# Patient Record
Sex: Female | Born: 1985 | Race: Black or African American | Hispanic: No | Marital: Single | Smoking: Never smoker
Health system: Southern US, Community
[De-identification: ages and names within clinical notes are randomized; demographics above are authoritative.]

## PROBLEM LIST (undated history)

## (undated) ENCOUNTER — Inpatient Hospital Stay: Payer: Self-pay

## (undated) DIAGNOSIS — Z8742 Personal history of other diseases of the female genital tract: Secondary | ICD-10-CM

## (undated) DIAGNOSIS — K219 Gastro-esophageal reflux disease without esophagitis: Secondary | ICD-10-CM

## (undated) HISTORY — PX: CHOLECYSTECTOMY: SHX55

## (undated) HISTORY — DX: Personal history of other diseases of the female genital tract: Z87.42

## (undated) HISTORY — DX: Gastro-esophageal reflux disease without esophagitis: K21.9

## (undated) HISTORY — PX: COLPOSCOPY: SHX161

---

## 2006-08-28 ENCOUNTER — Emergency Department: Payer: Self-pay | Admitting: Emergency Medicine

## 2006-10-05 ENCOUNTER — Emergency Department: Payer: Self-pay

## 2006-12-07 ENCOUNTER — Observation Stay: Payer: Self-pay

## 2007-01-14 ENCOUNTER — Observation Stay: Payer: Self-pay | Admitting: Unknown Physician Specialty

## 2007-03-11 ENCOUNTER — Observation Stay: Payer: Self-pay | Admitting: Obstetrics and Gynecology

## 2007-03-14 ENCOUNTER — Observation Stay: Payer: Self-pay

## 2007-03-26 ENCOUNTER — Observation Stay: Payer: Self-pay | Admitting: Obstetrics & Gynecology

## 2007-03-28 ENCOUNTER — Observation Stay: Payer: Self-pay

## 2007-04-19 ENCOUNTER — Inpatient Hospital Stay: Payer: Self-pay | Admitting: Obstetrics & Gynecology

## 2007-04-23 ENCOUNTER — Emergency Department: Payer: Self-pay | Admitting: Emergency Medicine

## 2010-04-10 ENCOUNTER — Emergency Department: Payer: Self-pay | Admitting: Emergency Medicine

## 2012-08-01 ENCOUNTER — Emergency Department: Payer: Self-pay | Admitting: Emergency Medicine

## 2012-08-01 LAB — CBC
HCT: 36 % (ref 35.0–47.0)
HGB: 12.3 g/dL (ref 12.0–16.0)
MCH: 31.6 pg (ref 26.0–34.0)
MCHC: 34.3 g/dL (ref 32.0–36.0)
MCV: 92 fL (ref 80–100)

## 2012-08-01 LAB — URINALYSIS, COMPLETE
Glucose,UR: NEGATIVE mg/dL (ref 0–75)
Nitrite: NEGATIVE
RBC,UR: 3 /HPF (ref 0–5)
Specific Gravity: 1.026 (ref 1.003–1.030)
Squamous Epithelial: 38

## 2012-08-01 LAB — COMPREHENSIVE METABOLIC PANEL
Albumin: 3.9 g/dL (ref 3.4–5.0)
Alkaline Phosphatase: 90 U/L (ref 50–136)
Anion Gap: 6 — ABNORMAL LOW (ref 7–16)
BUN: 8 mg/dL (ref 7–18)
Calcium, Total: 9 mg/dL (ref 8.5–10.1)
Glucose: 90 mg/dL (ref 65–99)
SGOT(AST): 27 U/L (ref 15–37)
SGPT (ALT): 23 U/L (ref 12–78)
Total Protein: 7.5 g/dL (ref 6.4–8.2)

## 2012-08-01 LAB — TROPONIN I: Troponin-I: <0.02 ng/mL

## 2012-11-30 ENCOUNTER — Encounter (HOSPITAL_COMMUNITY): Payer: Self-pay | Admitting: Emergency Medicine

## 2012-11-30 ENCOUNTER — Emergency Department (HOSPITAL_COMMUNITY)
Admission: EM | Admit: 2012-11-30 | Discharge: 2012-11-30 | Disposition: A | Discharge status: Home / Self Care | Payer: Self-pay | Attending: Emergency Medicine | Admitting: Emergency Medicine

## 2012-11-30 ENCOUNTER — Emergency Department (HOSPITAL_COMMUNITY): Payer: Self-pay

## 2012-11-30 DIAGNOSIS — Z3202 Encounter for pregnancy test, result negative: Secondary | ICD-10-CM | POA: Insufficient documentation

## 2012-11-30 DIAGNOSIS — R51 Headache: Secondary | ICD-10-CM | POA: Insufficient documentation

## 2012-11-30 DIAGNOSIS — R0602 Shortness of breath: Secondary | ICD-10-CM | POA: Insufficient documentation

## 2012-11-30 DIAGNOSIS — R059 Cough, unspecified: Secondary | ICD-10-CM | POA: Insufficient documentation

## 2012-11-30 DIAGNOSIS — K219 Gastro-esophageal reflux disease without esophagitis: Secondary | ICD-10-CM | POA: Insufficient documentation

## 2012-11-30 DIAGNOSIS — R0982 Postnasal drip: Secondary | ICD-10-CM | POA: Insufficient documentation

## 2012-11-30 DIAGNOSIS — J069 Acute upper respiratory infection, unspecified: Secondary | ICD-10-CM | POA: Insufficient documentation

## 2012-11-30 DIAGNOSIS — J3489 Other specified disorders of nose and nasal sinuses: Secondary | ICD-10-CM | POA: Insufficient documentation

## 2012-11-30 DIAGNOSIS — R05 Cough: Secondary | ICD-10-CM | POA: Insufficient documentation

## 2012-11-30 MED ORDER — BENZONATATE 100 MG PO CAPS
200.0000 mg | ORAL_CAPSULE | Freq: Once | ORAL | Status: AC
  Administered 2012-11-30: 200 mg via ORAL
  Filled 2012-11-30: qty 2

## 2012-11-30 MED ORDER — BENZONATATE 200 MG PO CAPS: 200.0000 mg | ORAL_CAPSULE | Freq: Three times a day (TID) | ORAL | Status: DC | PRN

## 2012-11-30 NOTE — ED Notes (Signed)
Patient c/o nadsal congestion with headache and sore throat. Patient reports occasional productive cough-thick yellow sputum.

## 2012-11-30 NOTE — ED Provider Notes (Signed)
History     CSN: [id number]  Arrival date & time 11/30/12  1020   First MD Initiated Contact with Patient 11/30/12 1025      Chief Complaint  Patient presents with  . Sore Throat  . Nasal Congestion  . Headache    (Consider location/radiation/quality/duration/timing/severity/associated sxs/prior treatment) HPI Comments: Natasha Myers presents with a one-day history of nasal congestion along with postnasal drip, generalized headache, sore throat and cough which has been productive of yellow sputum.  She does report shortness of breath but denies chest pain, fevers or chill and  Wheezing.  She has taken no medications prior to arrival.  She has no significant past medical history, but does use Nexium for chronic acid reflux disease.  Note, her mother is here also to be seen for similar symptoms.  The history is provided by the patient.    History reviewed. No pertinent past medical history.  Past Surgical History  Procedure Date  . Cesarean section     Family History  Problem Relation Age of Onset  . Diabetes Other     History  Substance Use Topics  . Smoking status: Never Smoker   . Smokeless tobacco: Never Used  . Alcohol Use: No    OB History    Grav Para Term Preterm Abortions TAB SAB Ect Mult Living   1 1 1       1       Review of Systems  Constitutional: Negative for fever and chills.  HENT: Positive for congestion, sore throat and rhinorrhea. Negative for trouble swallowing, neck pain, voice change and sinus pressure.   Eyes: Negative.   Respiratory: Positive for cough and shortness of breath. Negative for chest tightness.   Cardiovascular: Negative for chest pain.  Gastrointestinal: Negative for nausea and abdominal pain.  Genitourinary: Negative.   Musculoskeletal: Negative for joint swelling and arthralgias.  Skin: Negative.  Negative for rash and wound.  Neurological: Negative for dizziness, weakness, light-headedness, numbness and headaches.   Hematological: Negative.   Psychiatric/Behavioral: Negative.     Allergies  Review of patient's allergies indicates no known allergies.  Home Medications   Current Outpatient Rx  Name  Route  Sig  Dispense  Refill  . ESOMEPRAZOLE MAGNESIUM 20 MG PO CPDR   Oral   Take 20 mg by mouth 2 (two) times daily.         Marland Kitchen BENZONATATE 200 MG PO CAPS   Oral   Take 1 capsule (200 mg total) by mouth 3 (three) times daily as needed for cough.   20 capsule   0     LMP 11/30/2012  Physical Exam  Constitutional: She is oriented to person, place, and time. She appears well-developed and well-nourished.  HENT:  Head: Normocephalic and atraumatic.  Right Ear: Tympanic membrane, external ear and ear canal normal.  Left Ear: Tympanic membrane, external ear and ear canal normal.  Nose: Mucosal edema and rhinorrhea present. Right sinus exhibits no maxillary sinus tenderness and no frontal sinus tenderness. Left sinus exhibits no maxillary sinus tenderness and no frontal sinus tenderness.  Mouth/Throat: Uvula is midline, oropharynx is clear and moist and mucous membranes are normal. No oropharyngeal exudate, posterior oropharyngeal edema, posterior oropharyngeal erythema or tonsillar abscesses.  Eyes: Conjunctivae normal are normal.  Cardiovascular: Normal rate, regular rhythm and normal heart sounds.   Pulmonary/Chest: Effort normal. No respiratory distress. She has no wheezes. She has rhonchi. She has no rales.       Sparse rhonchi right  upper lung field which clears with cough.  Abdominal: Soft. There is no tenderness.  Musculoskeletal: Normal range of motion.  Neurological: She is alert and oriented to person, place, and time.  Skin: Skin is warm and dry. No rash noted.  Psychiatric: She has a normal mood and affect.    ED Course  Procedures (including critical care time)  Labs Reviewed - No data to display Dg Chest 2 View  11/30/2012  *RADIOLOGY REPORT*  Clinical Data: Cough and  congestion  CHEST - 2 VIEW  Comparison: None.  Findings: Cardiomediastinal silhouette is within normal limits. The lungs are clear. No pleural effusion.  No pneumothorax.  No acute osseous abnormality.  IMPRESSION: Normal chest.   Original Report Authenticated By: Christiana Pellant, M.D.      1. URI, acute       MDM  X-rays reviewed prior to discharge home.  She was given a dose of Tessalon while here which did help her coughing and she was prescribed additional Tessalon pearls for home use.  Encouraged rest, increase fluids.  When necessary followup.  Reasons to return include increased shortness of breath, weakness, fever.        Burgess Amor, [place] 11/30/12 [phone]

## 2012-12-01 LAB — POCT PREGNANCY, URINE: Preg Test, Ur: NEGATIVE

## 2012-12-01 NOTE — ED Provider Notes (Signed)
Medical screening examination/treatment/procedure(s) were performed by non-physician practitioner and as supervising physician I was immediately available for consultation/collaboration.  Shelda Jakes, MD 12/01/12 [phone]

## 2013-01-10 ENCOUNTER — Ambulatory Visit: Payer: Self-pay | Admitting: Family Medicine

## 2013-02-16 ENCOUNTER — Emergency Department: Payer: Self-pay | Admitting: Internal Medicine

## 2013-02-16 LAB — COMPREHENSIVE METABOLIC PANEL
Albumin: 3.3 g/dL — ABNORMAL LOW (ref 3.4–5.0)
Chloride: 106 mmol/L (ref 98–107)
Creatinine: 0.65 mg/dL (ref 0.60–1.30)
EGFR (Non-African Amer.): >60
Osmolality: 275 (ref 275–301)
Potassium: 3.3 mmol/L — ABNORMAL LOW (ref 3.5–5.1)
SGOT(AST): 38 U/L — ABNORMAL HIGH (ref 15–37)
SGPT (ALT): 49 U/L (ref 12–78)
Sodium: 139 mmol/L (ref 136–145)

## 2013-02-16 LAB — CBC
MCH: 31 pg (ref 26.0–34.0)
MCV: 94 fL (ref 80–100)
Platelet: 377 10*3/uL (ref 150–440)
RBC: 3.55 10*6/uL — ABNORMAL LOW (ref 3.80–5.20)
RDW: 12.6 % (ref 11.5–14.5)

## 2013-02-16 LAB — URINALYSIS, COMPLETE
Leukocyte Esterase: NEGATIVE
Ph: 6 (ref 4.5–8.0)
Protein: NEGATIVE
RBC,UR: 2 /HPF (ref 0–5)
WBC UR: 2 /HPF (ref 0–5)

## 2013-02-16 LAB — LIPASE, BLOOD: Lipase: 198 U/L (ref 73–393)

## 2013-07-15 IMAGING — US ABDOMEN ULTRASOUND
1 series · 14 of 25 positions shown · non-contrast
Comparison: none

REASON FOR EXAM: Abd Pain Upper
COMMENTS:

[Series 1: abdomen ultrasound · 0.25mm/px · 14 of 81 slices shown]
[im 1/81]
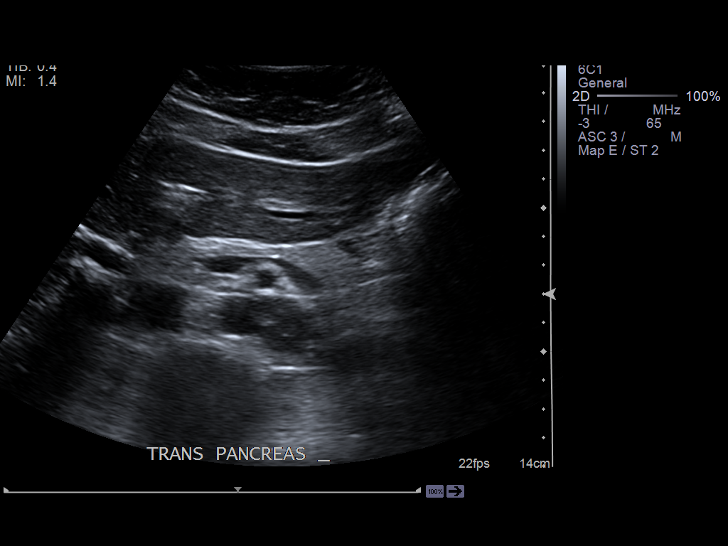
[im 7/81]
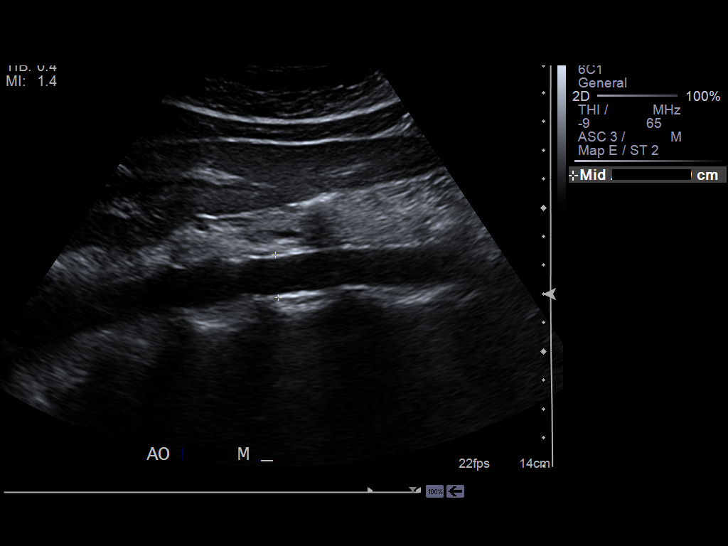
[im 14/81]
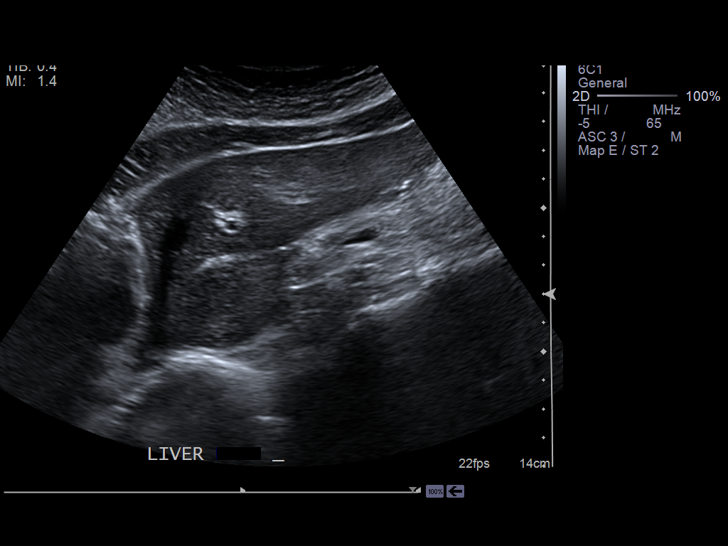
[im 21/81]
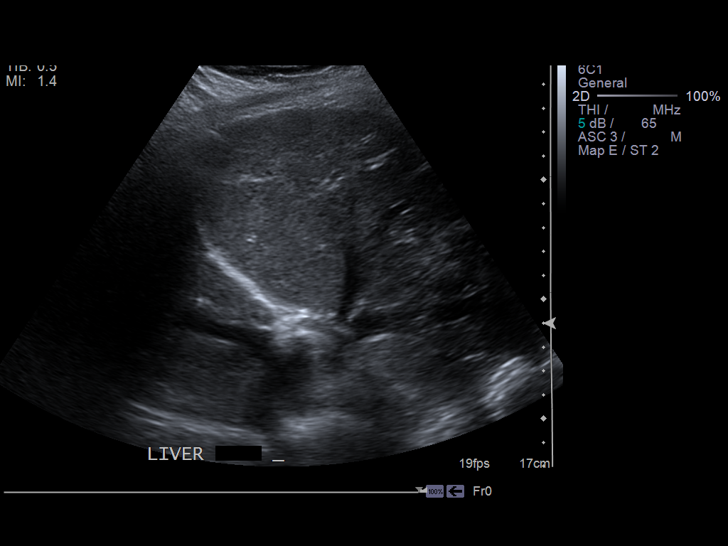
[im 27/81]
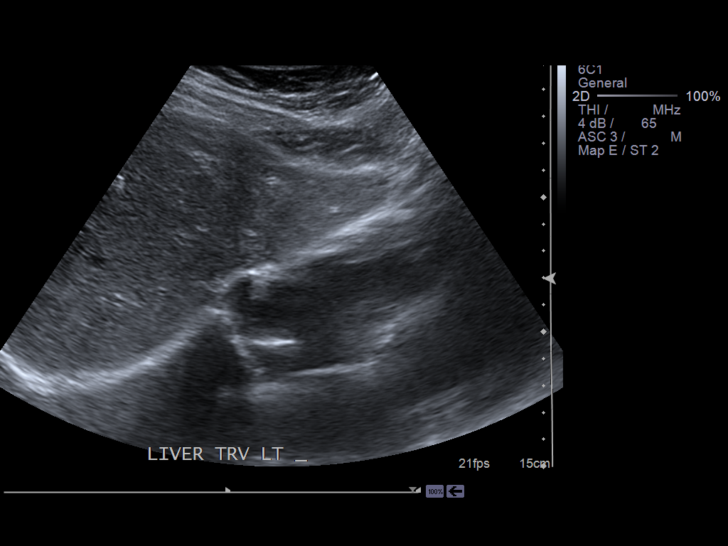
[im 31/81]
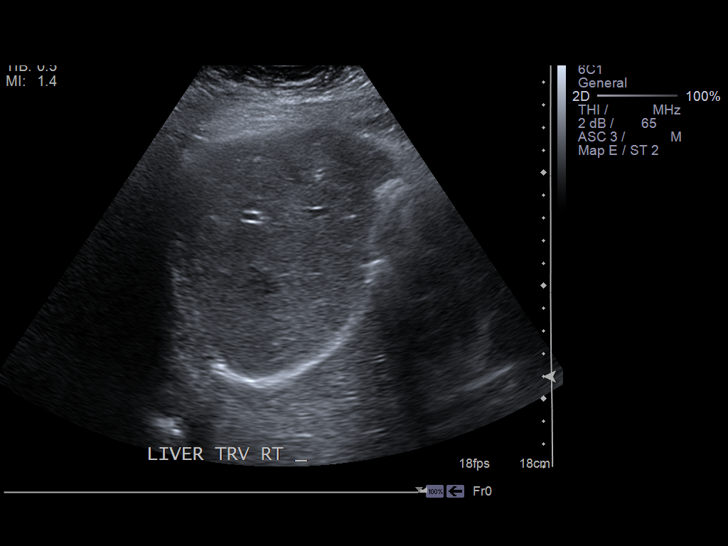
[im 37/81]
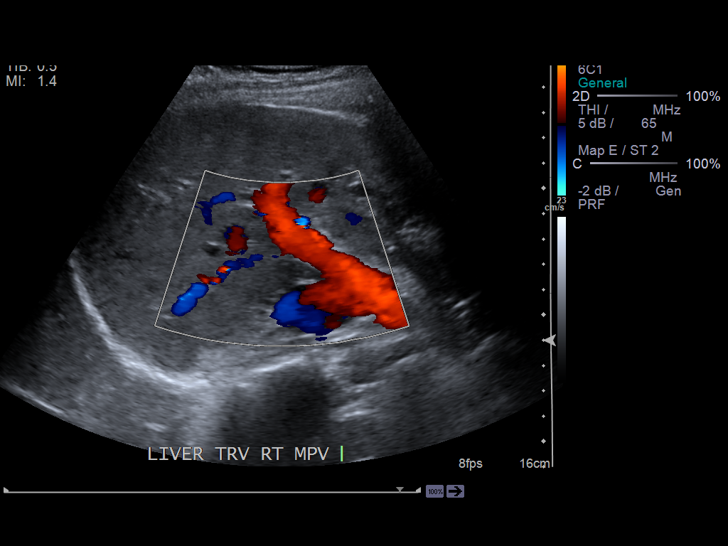
[im 44/81]
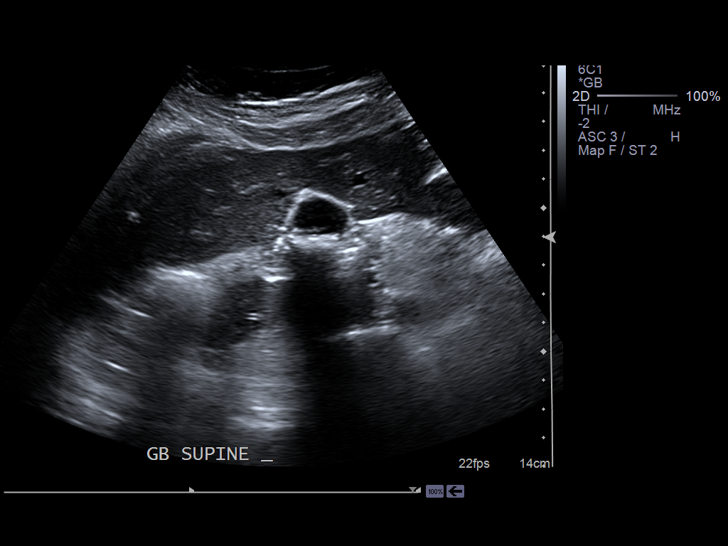
[im 51/81]
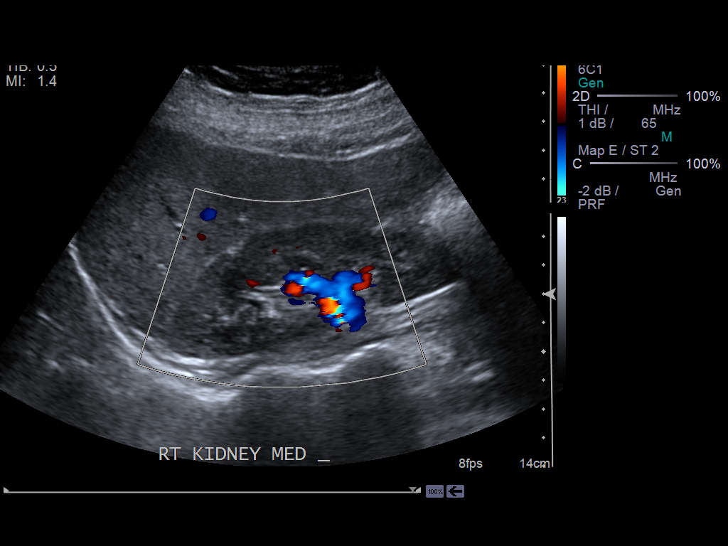
[im 54/81]
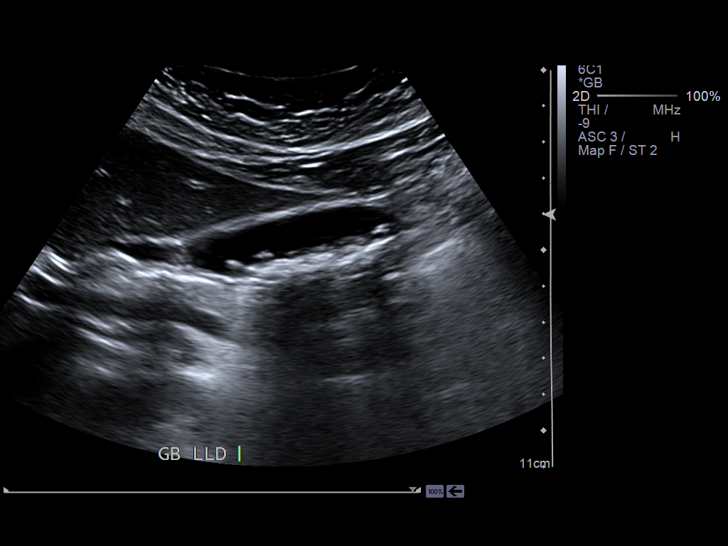
[im 61/81]
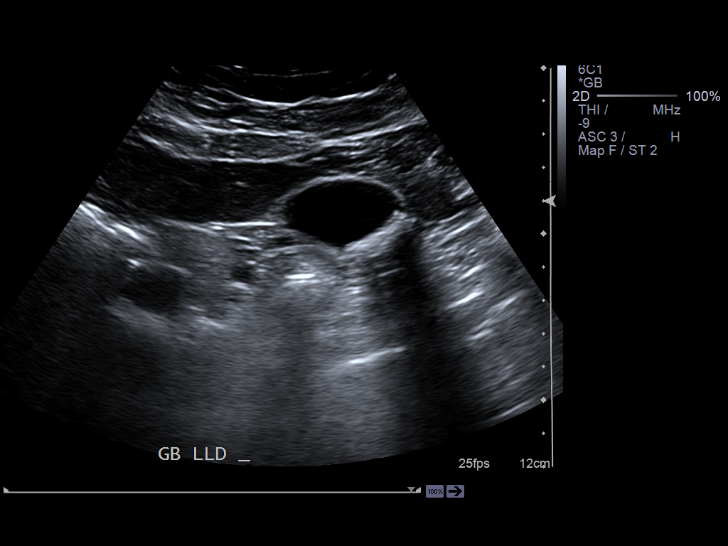
[im 67/81]
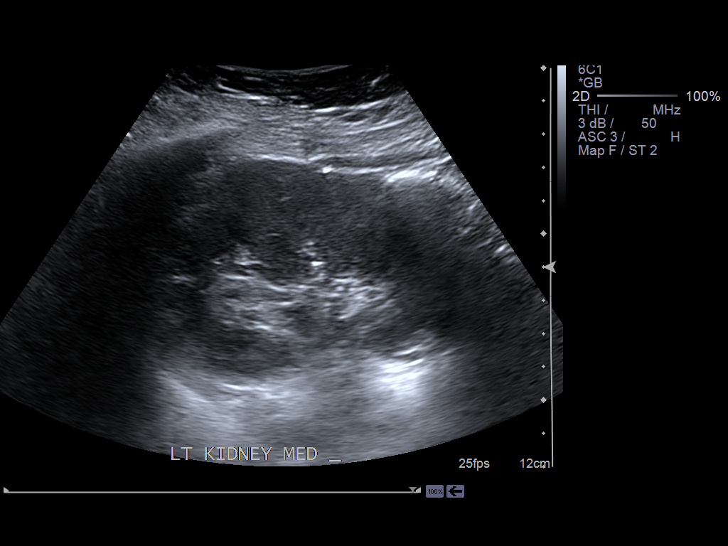
[im 74/81]
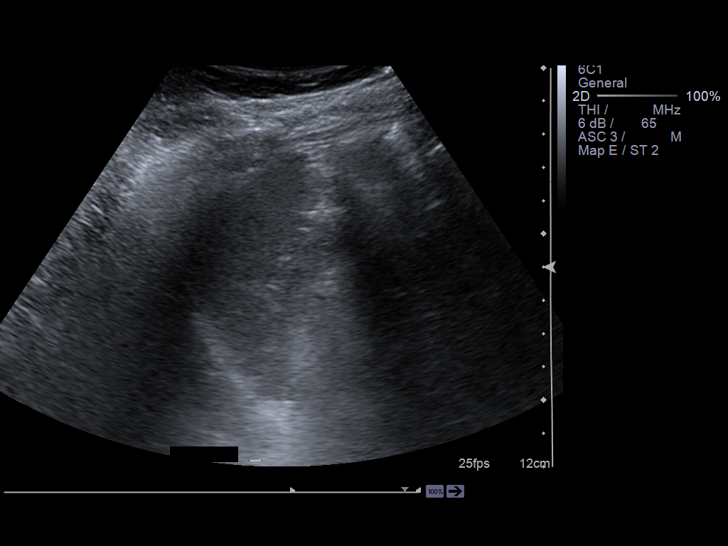
[im 81/81]
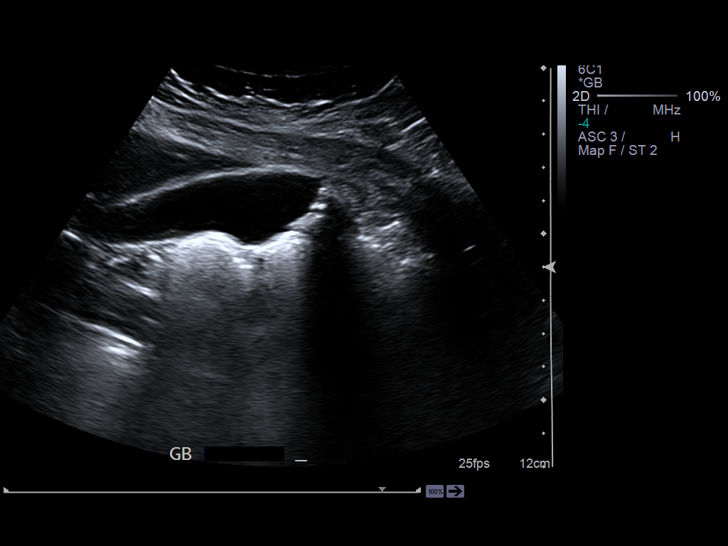

[14 of 25 positions shown; findings below may reference images not displayed]

PROCEDURE:     US  - US ABDOMEN GENERAL SURVEY  - January 10, 2013  [DATE]

RESULT:     The gallbladder is adequately distended and contains multiple
echogenic mobile shadowing stones. There is no gallbladder wall thickening
nor pericholecystic fluid. There is no positive sonographic Murphy's sign.

The common bile duct is normal at 1.6 mm in diameter. The liver exhibits
normal echotexture with no focal mass or ductal dilation. Portal venous flow
is normal in direction toward the liver. The pancreas, spleen, abdominal
aorta, inferior vena cava, and kidneys are normal in appearance. There is no
evidence of ascites.
IMPRESSION: 1. There are multiple gallstones present without evidence of acute
cholecystitis.
2. Elsewhere the abdominal structures exhibit no acute abnormalities.

[REDACTED]

## 2014-10-08 ENCOUNTER — Encounter (HOSPITAL_COMMUNITY): Payer: Self-pay | Admitting: Emergency Medicine

## 2014-11-25 IMAGING — CR DG CHEST 2V
2 series · 2 of 2 positions shown · non-contrast
Comparison: None.

CLINICAL DATA: Cough and congestion

CHEST - 2 VIEW

[view not recorded (1 of 2)]
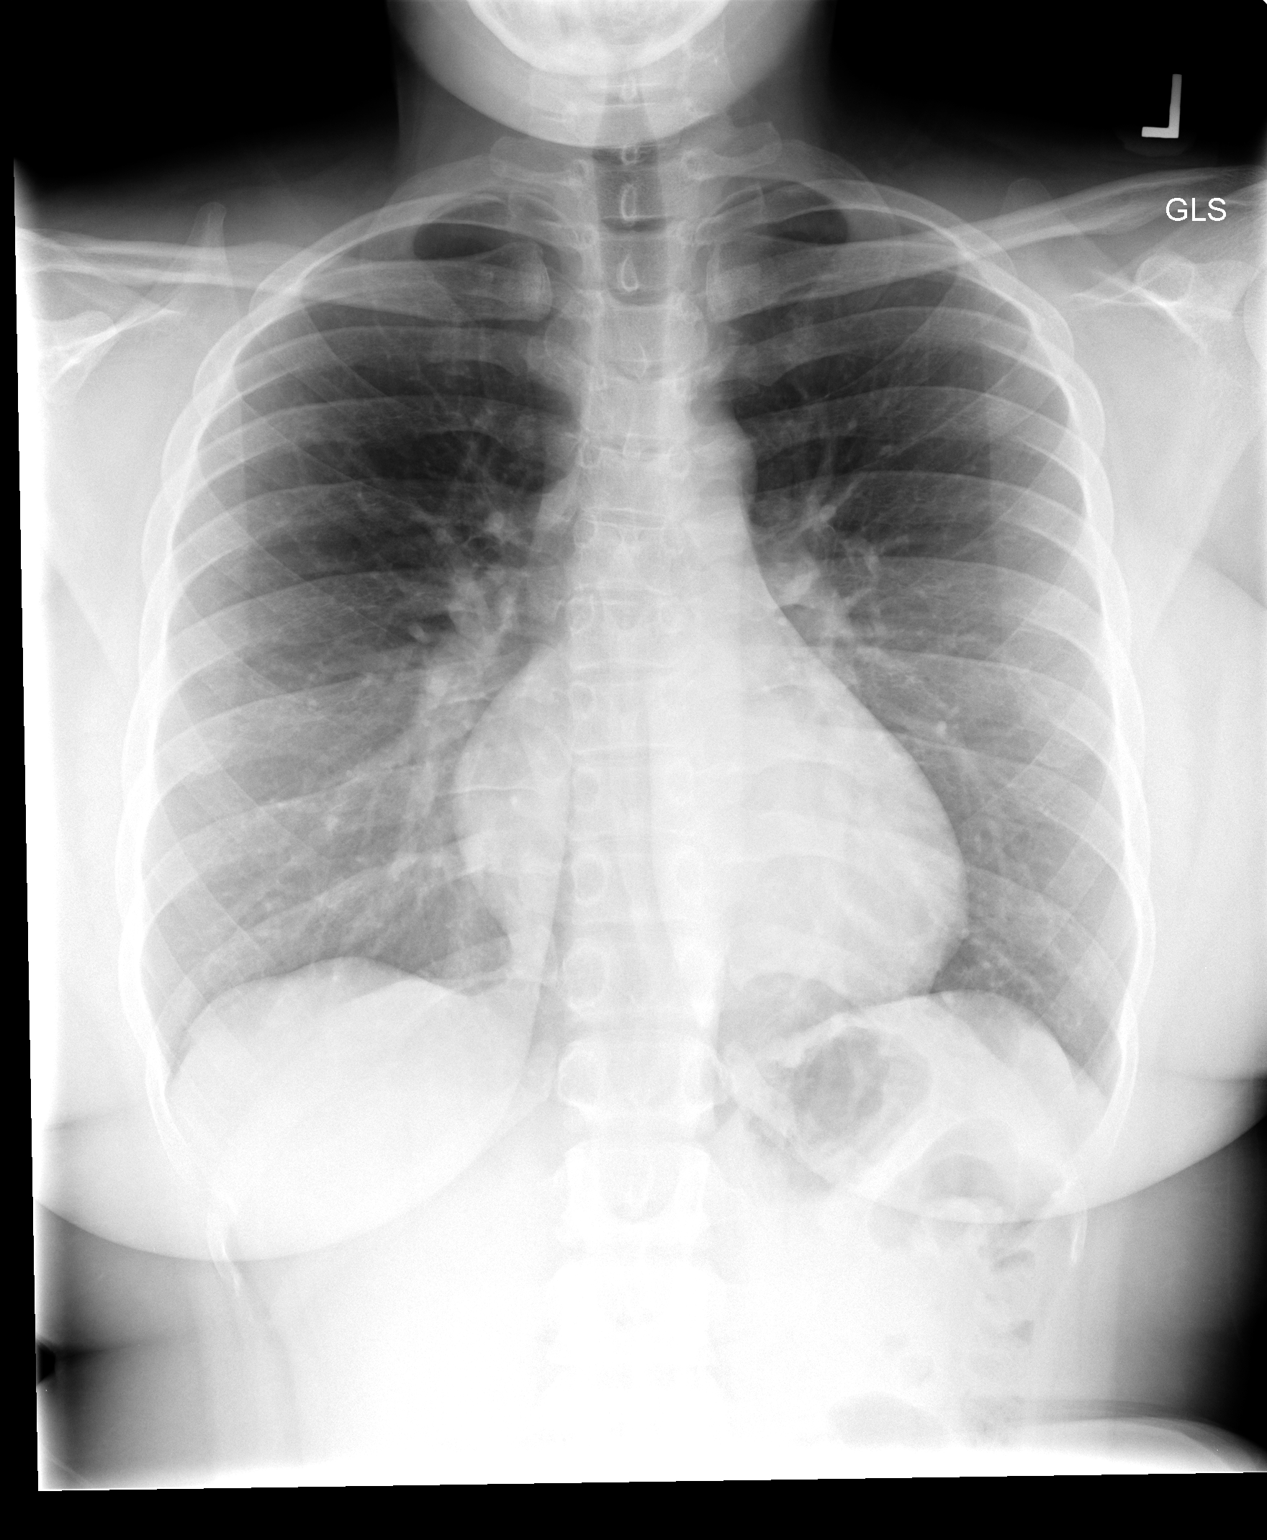

[view not recorded (2 of 2)]
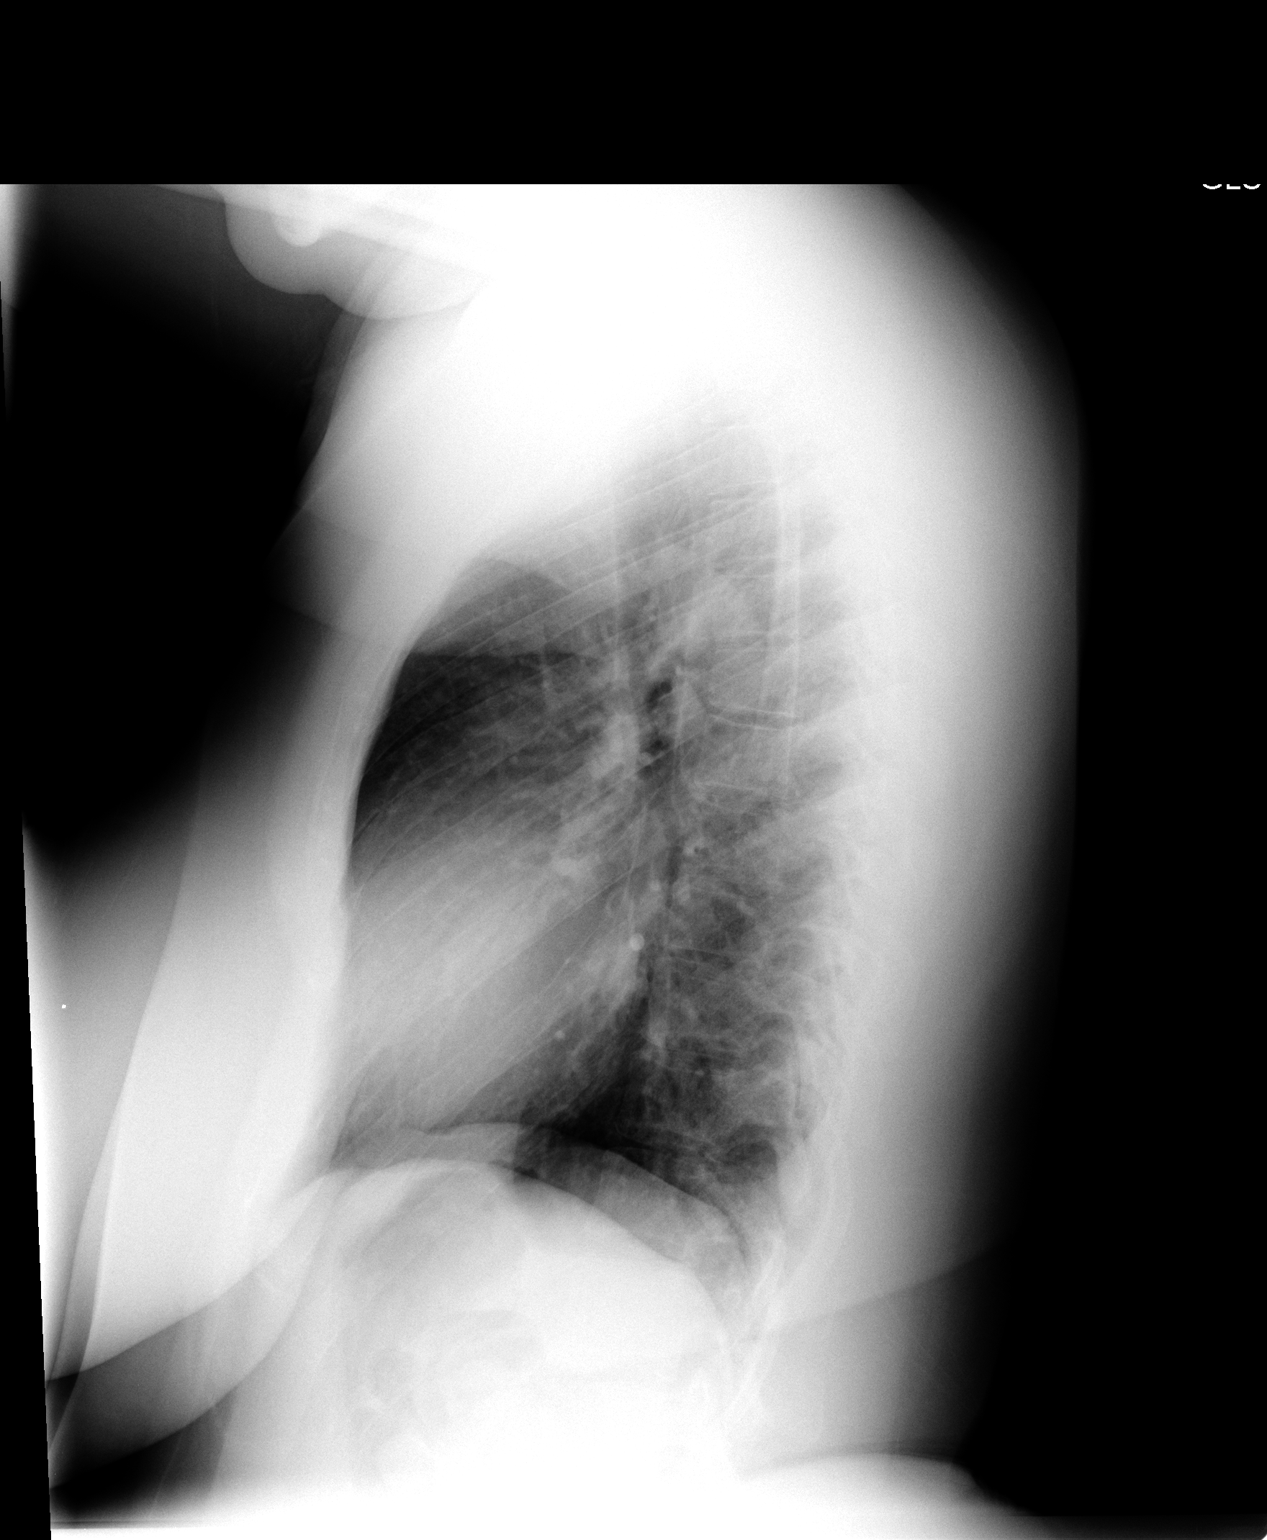

[2 of 2 positions shown; findings below may reference images not displayed]

FINDINGS: Cardiomediastinal silhouette is within normal limits. The
lungs are clear. No pleural effusion.  No pneumothorax.  No acute
osseous abnormality.
IMPRESSION: Normal chest.

## 2014-12-13 ENCOUNTER — Emergency Department: Payer: Self-pay | Admitting: Emergency Medicine

## 2015-05-11 ENCOUNTER — Encounter: Payer: Self-pay | Admitting: Emergency Medicine

## 2015-05-11 ENCOUNTER — Emergency Department
Admission: EM | Admit: 2015-05-11 | Discharge: 2015-05-11 | Disposition: A | Discharge status: Home / Self Care | Payer: Medicaid Other | Attending: Student | Admitting: Student

## 2015-05-11 DIAGNOSIS — N921 Excessive and frequent menstruation with irregular cycle: Secondary | ICD-10-CM | POA: Insufficient documentation

## 2015-05-11 DIAGNOSIS — R1031 Right lower quadrant pain: Secondary | ICD-10-CM | POA: Diagnosis present

## 2015-05-11 DIAGNOSIS — Z79899 Other long term (current) drug therapy: Secondary | ICD-10-CM | POA: Insufficient documentation

## 2015-05-11 LAB — CBC WITH DIFFERENTIAL/PLATELET
BASOS ABS: 0 10*3/uL (ref 0–0.1)
Basophils Relative: 1 %
EOS ABS: 0.1 10*3/uL (ref 0–0.7)
Eosinophils Relative: 1 %
HEMATOCRIT: 37.2 % (ref 35.0–47.0)
HEMOGLOBIN: 12.4 g/dL (ref 12.0–16.0)
LYMPHS PCT: 38 %
Lymphs Abs: 2.6 10*3/uL (ref 1.0–3.6)
MCH: 31.7 pg (ref 26.0–34.0)
MCHC: 33.4 g/dL (ref 32.0–36.0)
MCV: 95.1 fL (ref 80.0–100.0)
Monocytes Absolute: 0.4 10*3/uL (ref 0.2–0.9)
Monocytes Relative: 6 %
NEUTROS PCT: 54 %
Neutro Abs: 3.8 10*3/uL (ref 1.4–6.5)
PLATELETS: 249 10*3/uL (ref 150–440)
RBC: 3.91 MIL/uL (ref 3.80–5.20)
RDW: 12.7 % (ref 11.5–14.5)
WBC: 6.9 10*3/uL (ref 3.6–11.0)

## 2015-05-11 LAB — URINALYSIS COMPLETE WITH MICROSCOPIC (ARMC ONLY)
BACTERIA UA: NONE SEEN
BILIRUBIN URINE: NEGATIVE
GLUCOSE, UA: NEGATIVE mg/dL
KETONES UR: NEGATIVE mg/dL
Leukocytes, UA: NEGATIVE
NITRITE: NEGATIVE
Protein, ur: NEGATIVE mg/dL
SPECIFIC GRAVITY, URINE: 1.015 (ref 1.005–1.030)
pH: 7 (ref 5.0–8.0)

## 2015-05-11 LAB — COMPREHENSIVE METABOLIC PANEL
ALK PHOS: 66 U/L (ref 38–126)
ALT: 20 U/L (ref 14–54)
AST: 22 U/L (ref 15–41)
Albumin: 4.2 g/dL (ref 3.5–5.0)
Anion gap: 8 (ref 5–15)
BILIRUBIN TOTAL: 0.7 mg/dL (ref 0.3–1.2)
BUN: 9 mg/dL (ref 6–20)
CHLORIDE: 105 mmol/L (ref 101–111)
CO2: 27 mmol/L (ref 22–32)
Calcium: 9.3 mg/dL (ref 8.9–10.3)
Creatinine, Ser: 0.66 mg/dL (ref 0.44–1.00)
Glucose, Bld: 102 mg/dL — ABNORMAL HIGH (ref 65–99)
Potassium: 3.3 mmol/L — ABNORMAL LOW (ref 3.5–5.1)
Sodium: 140 mmol/L (ref 135–145)
Total Protein: 7.4 g/dL (ref 6.5–8.1)

## 2015-05-11 LAB — LIPASE, BLOOD: LIPASE: 32 U/L (ref 22–51)

## 2015-05-11 LAB — PREGNANCY, URINE: PREG TEST UR: NEGATIVE

## 2015-05-11 MED ORDER — IBUPROFEN 600 MG PO TABS
600.0000 mg | ORAL_TABLET | Freq: Once | ORAL | Status: AC
  Administered 2015-05-11: 600 mg via ORAL

## 2015-05-11 MED ORDER — IBUPROFEN 600 MG PO TABS
ORAL_TABLET | ORAL | Status: AC
  Administered 2015-05-11: 600 mg via ORAL
  Filled 2015-05-11: qty 1

## 2015-05-11 MED ORDER — IBUPROFEN 600 MG PO TABS: 600.0000 mg | ORAL_TABLET | Freq: Four times a day (QID) | ORAL | Status: DC | PRN

## 2015-05-11 NOTE — ED Notes (Signed)
Reports RLQ pain. States she started her period today and is having heavier bleeding than usual.  States it was late, should have started on 05/04/15.

## 2015-05-11 NOTE — ED Provider Notes (Signed)
[facility] Emergency Department Provider Note  ____________________________________________  Time seen: Approximately 9:14 PM  I have reviewed the triage vital signs and the nursing notes.   HISTORY  Chief Complaint Abdominal Pain    HPI Natasha Myers is a 29 y.o. female with history of heavy and irregular periods since for evaluation of heavy period/vaginal bleeding.. Patient reports that she began having her menstrual cycle today. She has changed 4 large pads. She is having lower abdominal cramping which is more persistent and severe than her usual period. She reports her period started today and it was one week late and that concerned her. Symptoms have been constant since onset. Current severity is moderate. No modifying factors. No nausea, vomiting, diarrhea, fevers or chills.   History reviewed. No pertinent past medical history.  There are no active problems to display for this patient.   Past Surgical History  Procedure Laterality Date  . Cesarean section      Current Outpatient Rx  Name  Route  Sig  Dispense  Refill  . benzonatate (TESSALON) 200 MG capsule   Oral   Take 1 capsule (200 mg total) by mouth 3 (three) times daily as needed for cough.   20 capsule   0   . esomeprazole (NEXIUM) 20 MG capsule   Oral   Take 20 mg by mouth 2 (two) times daily.           Allergies Review of patient's allergies indicates no known allergies.  Family History  Problem Relation Age of Onset  . Diabetes Other     Social History History  Substance Use Topics  . Smoking status: Never Smoker   . Smokeless tobacco: Never Used  . Alcohol Use: No    Review of Systems Constitutional: No fever/chills Eyes: No visual changes. ENT: No sore throat. Cardiovascular: Denies chest pain. Respiratory: Denies shortness of breath. Gastrointestinal: + abdominal pain.  No nausea, no vomiting.  No diarrhea.  No constipation. Genitourinary: Negative for  dysuria. Musculoskeletal: Negative for back pain. Skin: Negative for rash. Neurological: Negative for headaches, focal weakness or numbness.  10-point ROS otherwise negative.  ____________________________________________   PHYSICAL EXAM:  VITAL SIGNS: ED Triage Vitals  Enc Vitals Group     BP 05/11/15 1801 122/78 mmHg     Pulse Rate 05/11/15 1801 89     Resp 05/11/15 1801 18     Temp 05/11/15 1801 98.1 F (36.7 C)     Temp Source 05/11/15 1801 Oral     SpO2 05/11/15 1801 99 %     Weight 05/11/15 1801 165 lb (74.844 kg)     Height 05/11/15 1801  (1.575 m)     Head Cir --      Peak Flow --      Pain Score 05/11/15 1802 4     Pain Loc --      Pain Edu? --      Excl. in GC? --     Constitutional: Alert and oriented. Well appearing and in no acute distress. Eyes: Conjunctivae are normal. PERRL. EOMI. Head: Atraumatic. Nose: No congestion/rhinnorhea. Mouth/Throat: Mucous membranes are moist.  Oropharynx non-erythematous. Neck: No stridor.  Cardiovascular: Normal rate, regular rhythm. Grossly normal heart sounds.  Good peripheral circulation. Respiratory: Normal respiratory effort.  No retractions. Lungs CTAB. Gastrointestinal: Soft and nontender. No distention. No abdominal bruits. No CVA tenderness. Genitourinary: deferred *Musculoskeletal: No lower extremity tenderness nor edema.  No joint effusions. Neurologic:  Normal speech and language.  No gross focal neurologic deficits are appreciated. Speech is normal. No gait instability. Skin:  Skin is warm, dry and intact. No rash noted. Psychiatric: Mood and affect are normal. Speech and behavior are normal.  ____________________________________________   LABS (all labs ordered are listed, but only abnormal results are displayed)  Labs Reviewed  COMPREHENSIVE METABOLIC PANEL - Abnormal; Notable for the following:    Potassium 3.3 (*)    Glucose, Bld 102 (*)    All other components within normal limits  URINALYSIS  COMPLETEWITH MICROSCOPIC ([facility] ONLY) - Abnormal; Notable for the following:    Color, Urine YELLOW (*)    APPearance CLEAR (*)    Hgb urine dipstick 2+ (*)    Squamous Epithelial / LPF 0-5 (*)    All other components within normal limits  CBC WITH DIFFERENTIAL/PLATELET  LIPASE, BLOOD  PREGNANCY, URINE   ____________________________________________  EKG  none ____________________________________________  RADIOLOGY  none ____________________________________________   PROCEDURES  Procedure(s) performed: None  Critical Care performed: No  ____________________________________________   INITIAL IMPRESSION / ASSESSMENT AND PLAN / ED COURSE  Pertinent labs & imaging results that were available during my care of the patient were reviewed by me and considered in my medical decision making (see chart for details).  Natasha Myers is a 29 y.o. female with history of heavy and irregular periods since for evaluation of heavy period/vaginal bleeding today. On exam, she is very well-appearing and in no acute distress sitting up in bed texting on her phone. She has stable vital signs, she is afebrile. She has no tenderness to palpation throughout the abdomen and remainder of her exam is benign. Specifically, she has no tenderness to palpation in either the left or the right lower quadrant. Hemoglobin is stable. She is not pregnant. Discussed  need for close GYN follow-up even history of heavy and often irregular periods. We'll discharge with Motrin for cramping related to menstrual cycle. Return precautions discussed and she is comfortable with discharge plan. ____________________________________________   FINAL CLINICAL IMPRESSION(S) / ED DIAGNOSES  Final diagnoses:  Menometrorrhagia      Gayla DossEryka A Hilman Kissling, MD 05/11/15 2242

## 2015-11-27 ENCOUNTER — Emergency Department
Admission: EM | Admit: 2015-11-27 | Discharge: 2015-11-27 | Disposition: A | Discharge status: Home / Self Care | Payer: Medicaid Other | Attending: Emergency Medicine | Admitting: Emergency Medicine

## 2015-11-27 ENCOUNTER — Encounter: Payer: Self-pay | Admitting: *Deleted

## 2015-11-27 DIAGNOSIS — Z79899 Other long term (current) drug therapy: Secondary | ICD-10-CM | POA: Insufficient documentation

## 2015-11-27 DIAGNOSIS — R1013 Epigastric pain: Secondary | ICD-10-CM | POA: Diagnosis present

## 2015-11-27 DIAGNOSIS — K2100 Gastro-esophageal reflux disease with esophagitis, without bleeding: Secondary | ICD-10-CM

## 2015-11-27 DIAGNOSIS — K2970 Gastritis, unspecified, without bleeding: Secondary | ICD-10-CM | POA: Insufficient documentation

## 2015-11-27 DIAGNOSIS — Z3202 Encounter for pregnancy test, result negative: Secondary | ICD-10-CM | POA: Insufficient documentation

## 2015-11-27 LAB — URINALYSIS COMPLETE WITH MICROSCOPIC (ARMC ONLY)
BACTERIA UA: NONE SEEN
Bilirubin Urine: NEGATIVE
Glucose, UA: NEGATIVE mg/dL
LEUKOCYTES UA: NEGATIVE
NITRITE: NEGATIVE
PH: 5 (ref 5.0–8.0)
PROTEIN: 30 mg/dL — AB
SPECIFIC GRAVITY, URINE: 1.029 (ref 1.005–1.030)

## 2015-11-27 LAB — CBC
HEMATOCRIT: 38.4 % (ref 35.0–47.0)
HEMOGLOBIN: 12.6 g/dL (ref 12.0–16.0)
MCH: 31 pg (ref 26.0–34.0)
MCHC: 32.7 g/dL (ref 32.0–36.0)
MCV: 94.7 fL (ref 80.0–100.0)
Platelets: 241 10*3/uL (ref 150–440)
RBC: 4.05 MIL/uL (ref 3.80–5.20)
RDW: 12.8 % (ref 11.5–14.5)
WBC: 4.2 10*3/uL (ref 3.6–11.0)

## 2015-11-27 LAB — COMPREHENSIVE METABOLIC PANEL
ALBUMIN: 3.9 g/dL (ref 3.5–5.0)
ALK PHOS: 77 U/L (ref 38–126)
ALT: 24 U/L (ref 14–54)
ANION GAP: 6 (ref 5–15)
AST: 23 U/L (ref 15–41)
BILIRUBIN TOTAL: 0.4 mg/dL (ref 0.3–1.2)
BUN: 7 mg/dL (ref 6–20)
CALCIUM: 8.7 mg/dL — AB (ref 8.9–10.3)
CO2: 25 mmol/L (ref 22–32)
Chloride: 108 mmol/L (ref 101–111)
Creatinine, Ser: 0.53 mg/dL (ref 0.44–1.00)
Glucose, Bld: 88 mg/dL (ref 65–99)
POTASSIUM: 3.4 mmol/L — AB (ref 3.5–5.1)
Sodium: 139 mmol/L (ref 135–145)
TOTAL PROTEIN: 7.2 g/dL (ref 6.5–8.1)

## 2015-11-27 LAB — LIPASE, BLOOD: Lipase: 18 U/L (ref 11–51)

## 2015-11-27 LAB — POCT PREGNANCY, URINE: PREG TEST UR: NEGATIVE

## 2015-11-27 MED ORDER — FAMOTIDINE 20 MG PO TABS
40.0000 mg | ORAL_TABLET | Freq: Once | ORAL | Status: AC
  Administered 2015-11-27: 40 mg via ORAL
  Filled 2015-11-27: qty 2

## 2015-11-27 MED ORDER — RANITIDINE HCL 150 MG PO CAPS: 150.0000 mg | ORAL_CAPSULE | Freq: Two times a day (BID) | ORAL | Status: DC

## 2015-11-27 MED ORDER — SUCRALFATE 1 G PO TABS: 1.0000 g | ORAL_TABLET | Freq: Four times a day (QID) | ORAL | Status: DC

## 2015-11-27 MED ORDER — GI COCKTAIL ~~LOC~~
30.0000 mL | ORAL | Status: AC
  Administered 2015-11-27: 30 mL via ORAL
  Filled 2015-11-27: qty 30

## 2015-11-27 NOTE — Discharge Instructions (Signed)
Gastritis, Adult °Gastritis is soreness and swelling (inflammation) of the lining of the stomach. Gastritis can develop as a sudden onset (acute) or Wubben-term (chronic) condition. If gastritis is not treated, it can lead to stomach bleeding and ulcers. °CAUSES  °Gastritis occurs when the stomach lining is weak or damaged. Digestive juices from the stomach then inflame the weakened stomach lining. The stomach lining may be weak or damaged due to viral or bacterial infections. One common bacterial infection is the Helicobacter pylori infection. Gastritis can also result from excessive alcohol consumption, taking certain medicines, or having too much acid in the stomach.  °SYMPTOMS  °In some cases, there are no symptoms. When symptoms are present, they may include: °· Pain or a burning sensation in the upper abdomen. °· Nausea. °· Vomiting. °· An uncomfortable feeling of fullness after eating. °DIAGNOSIS  °Your caregiver may suspect you have gastritis based on your symptoms and a physical exam. To determine the cause of your gastritis, your caregiver may perform the following: °· Blood or stool tests to check for the H pylori bacterium. °· Gastroscopy. A thin, flexible tube (endoscope) is passed down the esophagus and into the stomach. The endoscope has a light and camera on the end. Your caregiver uses the endoscope to view the inside of the stomach. °· Taking a tissue sample (biopsy) from the stomach to examine under a microscope. °TREATMENT  °Depending on the cause of your gastritis, medicines may be prescribed. If you have a bacterial infection, such as an H pylori infection, antibiotics may be given. If your gastritis is caused by too much acid in the stomach, H2 blockers or antacids may be given. Your caregiver may recommend that you stop taking aspirin, ibuprofen, or other nonsteroidal anti-inflammatory drugs (NSAIDs). °HOME CARE INSTRUCTIONS °· Only take over-the-counter or prescription medicines as directed by  your caregiver. °· If you were given antibiotic medicines, take them as directed. Finish them even if you start to feel better. °· Drink enough fluids to keep your urine clear or pale yellow. °· Avoid foods and drinks that make your symptoms worse, such as: °· Caffeine or alcoholic drinks. °· Chocolate. °· Peppermint or mint flavorings. °· Garlic and onions. °· Spicy foods. °· Citrus fruits, such as oranges, lemons, or limes. °· Tomato-based foods such as sauce, chili, salsa, and pizza. °· Fried and fatty foods. °· Eat small, frequent meals instead of large meals. °SEEK IMMEDIATE MEDICAL CARE IF:  °· You have black or dark red stools. °· You vomit blood or material that looks like coffee grounds. °· You are unable to keep fluids down. °· Your abdominal pain gets worse. °· You have a fever. °· You do not feel better after 1 week. °· You have any other questions or concerns. °MAKE SURE YOU: °· Understand these instructions. °· Will watch your condition. °· Will get help right away if you are not doing well or get worse. °  °This information is not intended to replace advice given to you by your health care provider. Make sure you discuss any questions you have with your health care provider. °  °Document Released: 11/17/2001 Document Revised: 05/24/2012 Document Reviewed: 01/06/2012 °Elsevier Interactive Patient Education ©2016 [employer] °Gastroesophageal Reflux Disease, Adult °Normally, food travels down the esophagus and stays in the stomach to be digested. However, when a person has gastroesophageal reflux disease (GERD), food and stomach acid move back up into the esophagus. When this happens, the esophagus becomes sore and inflamed. Over time, GERD can   create small holes (ulcers) in the lining of the esophagus.  °CAUSES °This condition is caused by a problem with the muscle between the esophagus and the stomach (lower esophageal sphincter, or LES). Normally, the LES muscle closes after food passes through the  esophagus to the stomach. When the LES is weakened or abnormal, it does not close properly, and that allows food and stomach acid to go back up into the esophagus. The LES can be weakened by certain dietary substances, medicines, and medical conditions, including: °· Tobacco use. °· Pregnancy. °· Having a hiatal hernia. °· Heavy alcohol use. °· Certain foods and beverages, such as coffee, chocolate, onions, and peppermint. °RISK FACTORS °This condition is more likely to develop in: °· People who have an increased body weight. °· People who have connective tissue disorders. °· People who use NSAID medicines. °SYMPTOMS °Symptoms of this condition include: °· Heartburn. °· Difficult or painful swallowing. °· The feeling of having a lump in the throat. °· A bitter taste in the mouth. °· Bad breath. °· Having a large amount of saliva. °· Having an upset or bloated stomach. °· Belching. °· Chest pain. °· Shortness of breath or wheezing. °· Ongoing (chronic) cough or a night-time cough. °· Wearing away of tooth enamel. °· Weight loss. °Different conditions can cause chest pain. Make sure to see your health care provider if you experience chest pain. °DIAGNOSIS °Your health care provider will take a medical history and perform a physical exam. To determine if you have mild or severe GERD, your health care provider may also monitor how you respond to treatment. You may also have other tests, including: °· An endoscopy to examine your stomach and esophagus with a small camera. °· A test that measures the acidity level in your esophagus. °· A test that measures how much pressure is on your esophagus. °· A barium swallow or modified barium swallow to show the shape, size, and functioning of your esophagus. °TREATMENT °The goal of treatment is to help relieve your symptoms and to prevent complications. Treatment for this condition may vary depending on how severe your symptoms are. Your health care provider may  recommend: °· Changes to your diet. °· Medicine. °· Surgery. °HOME CARE INSTRUCTIONS °Diet °· Follow a diet as recommended by your health care provider. This may involve avoiding foods and drinks such as: °¨ Coffee and tea (with or without caffeine). °¨ Drinks that contain alcohol. °¨ Energy drinks and sports drinks. °¨ Carbonated drinks or sodas. °¨ Chocolate and cocoa. °¨ Peppermint and mint flavorings. °¨ Garlic and onions. °¨ Horseradish. °¨ Spicy and acidic foods, including peppers, chili powder, curry powder, vinegar, hot sauces, and barbecue sauce. °¨ Citrus fruit juices and citrus fruits, such as oranges, lemons, and limes. °¨ Tomato-based foods, such as red sauce, chili, salsa, and pizza with red sauce. °¨ Fried and fatty foods, such as donuts, french fries, potato chips, and high-fat dressings. °¨ High-fat meats, such as hot dogs and fatty cuts of red and white meats, such as rib eye steak, sausage, ham, and bacon. °¨ High-fat dairy items, such as whole milk, butter, and cream cheese. °· Eat small, frequent meals instead of large meals. °· Avoid drinking large amounts of liquid with your meals. °· Avoid eating meals during the 2-3 hours before bedtime. °· Avoid lying down right after you eat. °· Do not exercise right after you eat. ° General Instructions  °· Pay attention to any changes in your symptoms. °· Take over-the-counter and prescription medicines only as told by your   health care provider. Do not take aspirin, ibuprofen, or other NSAIDs unless your health care provider told you to do so. °· Do not use any tobacco products, including cigarettes, chewing tobacco, and e-cigarettes. If you need help quitting, ask your health care provider. °· Wear loose-fitting clothing. Do not wear anything tight around your waist that causes pressure on your abdomen. °· Raise (elevate) the head of your bed 6 inches (15cm). °· Try to reduce your stress, such as with yoga or meditation. If you need help reducing  stress, ask your health care provider. °· If you are overweight, reduce your weight to an amount that is healthy for you. Ask your health care provider for guidance about a safe weight loss goal. °· Keep all follow-up visits as told by your health care provider. This is important. °SEEK MEDICAL CARE IF: °· You have new symptoms. °· You have unexplained weight loss. °· You have difficulty swallowing, or it hurts to swallow. °· You have wheezing or a persistent cough. °· Your symptoms do not improve with treatment. °· You have a hoarse voice. °SEEK IMMEDIATE MEDICAL CARE IF: °· You have pain in your arms, neck, jaw, teeth, or back. °· You feel sweaty, dizzy, or light-headed. °· You have chest pain or shortness of breath. °· You vomit and your vomit looks like blood or coffee grounds. °· You faint. °· Your stool is bloody or black. °· You cannot swallow, drink, or eat. °  °This information is not intended to replace advice given to you by your health care provider. Make sure you discuss any questions you have with your health care provider. °  °Document Released: 09/02/2005 Document Revised: 08/14/2015 Document Reviewed: 03/20/2015 °Elsevier Interactive Patient Education ©2016 [employer] ° °

## 2015-11-27 NOTE — ED Provider Notes (Signed)
[facility] Emergency Department Provider Note  ____________________________________________  Time seen: 2:15 PM  I have reviewed the triage vital signs and the nursing notes.   HISTORY  Chief Complaint Abdominal Pain    HPI Natasha Myers is a 29 y.o. female who complains of epigastric pain for the last 2 nights. It's worse at night when lying down flat, better sitting up. She has a history of H. pylori and cholecystectomy. She has some nausea and vomiting 2 days ago as well. No diarrhea, no muscle aches or viral symptoms. No chest pain shortness of breath exertional symptoms or pleuritic symptoms. Currently not taking any antacids.     History reviewed. No pertinent past medical history. H. pylori peptic ulcer disease  There are no active problems to display for this patient.    Past Surgical History  Procedure Laterality Date  . Cesarean section    . Cholecystectomy       Current Outpatient Rx  Name  Route  Sig  Dispense  Refill  . benzonatate (TESSALON) 200 MG capsule   Oral   Take 1 capsule (200 mg total) by mouth 3 (three) times daily as needed for cough.   20 capsule   0   . esomeprazole (NEXIUM) 20 MG capsule   Oral   Take 20 mg by mouth 2 (two) times daily.         Marland Kitchen ibuprofen (ADVIL,MOTRIN) 600 MG tablet   Oral   Take 1 tablet (600 mg total) by mouth every 6 (six) hours as needed for moderate pain.   15 tablet   0   . ranitidine (ZANTAC) 150 MG capsule   Oral   Take 1 capsule (150 mg total) by mouth 2 (two) times daily.   28 capsule   0   . sucralfate (CARAFATE) 1 G tablet   Oral   Take 1 tablet (1 g total) by mouth 4 (four) times daily.   120 tablet   1      Allergies Review of patient's allergies indicates no known allergies.   Family History  Problem Relation Age of Onset  . Diabetes Other     Social History Social History  Substance Use Topics  . Smoking status: Never Smoker   . Smokeless tobacco:  Never Used  . Alcohol Use: No    Review of Systems  Constitutional:   No fever or chills. No weight changes Eyes:   No blurry vision or double vision.  ENT:   No sore throat. Cardiovascular:   No chest pain. Respiratory:   No dyspnea or cough. Gastrointestinal:   Positive epigastric pain with vomiting.  No BRBPR or melena. Genitourinary:   Negative for dysuria, urinary retention, bloody urine, or difficulty urinating. Musculoskeletal:   Negative for back pain. No joint swelling or pain. Skin:   Negative for rash. Neurological:   Negative for headaches, focal weakness or numbness. Psychiatric:  No anxiety or depression.   Endocrine:  No hot/cold intolerance, changes in energy, or sleep difficulty.  10-point ROS otherwise negative.  ____________________________________________   PHYSICAL EXAM:  VITAL SIGNS: ED Triage Vitals  Enc Vitals Group     BP 11/27/15 1319 133/71 mmHg     Pulse Rate 11/27/15 1319 90     Resp 11/27/15 1319 20     Temp 11/27/15 1319 98 F (36.7 C)     Temp Source 11/27/15 1319 Oral     SpO2 11/27/15 1319 100 %     Weight  11/27/15 1319 161 lb (73.029 kg)     Height 11/27/15 1319 5\' 2"  (1.575 m)     Head Cir --      Peak Flow --      Pain Score 11/27/15 1319 5     Pain Loc --      Pain Edu? --      Excl. in GC? --     Vital signs reviewed, nursing assessments reviewed.   Constitutional:   Alert and oriented. Well appearing and in no distress. Eyes:   No scleral icterus. No conjunctival pallor. PERRL. EOMI ENT   Head:   Normocephalic and atraumatic.   Nose:   No congestion/rhinnorhea. No septal hematoma   Mouth/Throat:   MMM, no pharyngeal erythema. No peritonsillar mass. No uvula shift.   Neck:   No stridor. No SubQ emphysema. No meningismus. Hematological/Lymphatic/Immunilogical:   No cervical lymphadenopathy. Cardiovascular:   RRR. Normal and symmetric distal pulses are present in all extremities. No murmurs, rubs, or  gallops. Respiratory:   Normal respiratory effort without tachypnea nor retractions. Breath sounds are clear and equal bilaterally. No wheezes/rales/rhonchi. Gastrointestinal:   Soft with left upper quadrant and epigastric pain. No distention. There is no CVA tenderness.  No rebound, rigidity, or guarding. Genitourinary:   deferred Musculoskeletal:   Nontender with normal range of motion in all extremities. No joint effusions.  No lower extremity tenderness.  No edema. Neurologic:   Normal speech and language.  CN 2-10 normal. Motor grossly intact. No pronator drift.  Normal gait. No gross focal neurologic deficits are appreciated.  Skin:    Skin is warm, dry and intact. No rash noted.  No petechiae, purpura, or bullae. Psychiatric:   Mood and affect are normal. Speech and behavior are normal. Patient exhibits appropriate insight and judgment.  ____________________________________________    LABS (pertinent positives/negatives) (all labs ordered are listed, but only abnormal results are displayed) Labs Reviewed  COMPREHENSIVE METABOLIC PANEL - Abnormal; Notable for the following:    Potassium 3.4 (*)    Calcium 8.7 (*)    All other components within normal limits  URINALYSIS COMPLETEWITH MICROSCOPIC ([facility] ONLY) - Abnormal; Notable for the following:    Color, Urine YELLOW (*)    APPearance CLEAR (*)    Ketones, ur 1+ (*)    Hgb urine dipstick 1+ (*)    Protein, ur 30 (*)    Squamous Epithelial / LPF 0-5 (*)    All other components within normal limits  LIPASE, BLOOD  CBC  POC URINE PREG, ED  POCT PREGNANCY, URINE   ____________________________________________   EKG  Interpreted by me Normal sinus rhythm rate of 82, normal axis and intervals, normal QRS and ST segments. There are T-wave inversions in 3 aVF and V3, which is unchanged compared to 8/26//2013  ____________________________________________     RADIOLOGY    ____________________________________________   PROCEDURES   ____________________________________________   INITIAL IMPRESSION / ASSESSMENT AND PLAN / ED COURSE  Pertinent labs & imaging results that were available during my care of the patient were reviewed by me and considered in my medical decision making (see chart for details).  Patient's very well-appearing, calm and comfortable, no distress. Symptoms are suggestive of gastritis and GERD which is also consistent with her medical history. Exam is also consistent with this conclusion, and is otherwise reassuring. Vital signs are normal, labs are unremarkable. We'll start the patient on a trial of antacids and have her follow up with primary care. Also recommended that  she follow up with GI if her symptoms are not completely controlled by these medications that she may need further consideration of peptic ulcer disease evaluation again.     ____________________________________________   FINAL CLINICAL IMPRESSION(S) / ED DIAGNOSES  Final diagnoses:  Gastritis  Gastroesophageal reflux disease with esophagitis      Sharman Cheek, MD 11/27/15 1454

## 2015-11-27 NOTE — ED Notes (Signed)
Pt reports abdominal pain, pt had nausea and vomiting on Monday

## 2016-11-15 ENCOUNTER — Emergency Department: Payer: Medicaid Other

## 2016-11-15 ENCOUNTER — Emergency Department
Admission: EM | Admit: 2016-11-15 | Discharge: 2016-11-15 | Disposition: A | Discharge status: Home / Self Care | Payer: Medicaid Other | Attending: Emergency Medicine | Admitting: Emergency Medicine

## 2016-11-15 DIAGNOSIS — R102 Pelvic and perineal pain: Secondary | ICD-10-CM | POA: Insufficient documentation

## 2016-11-15 DIAGNOSIS — Z79899 Other long term (current) drug therapy: Secondary | ICD-10-CM | POA: Diagnosis not present

## 2016-11-15 DIAGNOSIS — N949 Unspecified condition associated with female genital organs and menstrual cycle: Secondary | ICD-10-CM

## 2016-11-15 DIAGNOSIS — O26891 Other specified pregnancy related conditions, first trimester: Secondary | ICD-10-CM | POA: Diagnosis not present

## 2016-11-15 DIAGNOSIS — Z3A01 Less than 8 weeks gestation of pregnancy: Secondary | ICD-10-CM | POA: Insufficient documentation

## 2016-11-15 DIAGNOSIS — R1030 Lower abdominal pain, unspecified: Secondary | ICD-10-CM | POA: Insufficient documentation

## 2016-11-15 LAB — CHLAMYDIA/NGC RT PCR (ARMC ONLY)
Chlamydia Tr: NOT DETECTED
N gonorrhoeae: NOT DETECTED

## 2016-11-15 LAB — CBC
HEMATOCRIT: 37.7 % (ref 35.0–47.0)
HEMOGLOBIN: 12.8 g/dL (ref 12.0–16.0)
MCH: 32.3 pg (ref 26.0–34.0)
MCHC: 34.1 g/dL (ref 32.0–36.0)
MCV: 94.7 fL (ref 80.0–100.0)
Platelets: 287 10*3/uL (ref 150–440)
RBC: 3.97 MIL/uL (ref 3.80–5.20)
RDW: 12.6 % (ref 11.5–14.5)
WBC: 6.3 10*3/uL (ref 3.6–11.0)

## 2016-11-15 LAB — BASIC METABOLIC PANEL
ANION GAP: 7 (ref 5–15)
BUN: 12 mg/dL (ref 6–20)
CALCIUM: 9.4 mg/dL (ref 8.9–10.3)
CHLORIDE: 104 mmol/L (ref 101–111)
CO2: 25 mmol/L (ref 22–32)
Creatinine, Ser: 0.59 mg/dL (ref 0.44–1.00)
GFR calc non Af Amer: >60 mL/min (ref >60)
Glucose, Bld: 91 mg/dL (ref 65–99)
Potassium: 3.5 mmol/L (ref 3.5–5.1)
Sodium: 136 mmol/L (ref 135–145)

## 2016-11-15 LAB — WET PREP, GENITAL
Clue Cells Wet Prep HPF POC: NONE SEEN
SPERM: NONE SEEN
TRICH WET PREP: NONE SEEN
Yeast Wet Prep HPF POC: NONE SEEN

## 2016-11-15 LAB — URINALYSIS, COMPLETE (UACMP) WITH MICROSCOPIC
BACTERIA UA: NONE SEEN
BILIRUBIN URINE: NEGATIVE
Glucose, UA: NEGATIVE mg/dL
HGB URINE DIPSTICK: NEGATIVE
Ketones, ur: 20 mg/dL — AB
LEUKOCYTES UA: NEGATIVE
NITRITE: NEGATIVE
Protein, ur: NEGATIVE mg/dL
SPECIFIC GRAVITY, URINE: 1.026 (ref 1.005–1.030)
pH: 5 (ref 5.0–8.0)

## 2016-11-15 LAB — HCG, QUANTITATIVE, PREGNANCY: HCG, BETA CHAIN, QUANT, S: 4319 m[IU]/mL — AB (ref <5)

## 2016-11-15 NOTE — ED Notes (Signed)
Patient transported to Ultrasound 

## 2016-11-15 NOTE — ED Triage Notes (Signed)
Pt c/o lower abd cramping for the past couple of days, states she just found out she is pregnant last week. Denies any vaginal bleeding at present.

## 2016-11-15 NOTE — Discharge Instructions (Signed)
There are many causes of pelvic pain in pregnancy. Please make sure to discuss your discomfort with your OB/GYN for further evaluation and treatment. You may take Tylenol for your pain.  Return to the emergency department if you develop vaginal bleeding, lightheadedness or fainting, severe pain, fever, or any other symptoms concerning to you.

## 2016-11-15 NOTE — ED Provider Notes (Signed)
[facility] Emergency Department Provider Note  ____________________________________________  Time seen: Approximately 11:31 AM  I have reviewed the triage vital signs and the nursing notes.   HISTORY  Chief Complaint Abdominal Pain    HPI Natasha Myers is a 30 y.o. female G3 P1 A1 approximately [redacted] weeks pregnant presenting with lower abdominal pressure. The patient reports that over the last 3 or 4 days, she has had a pressure sensation that is sometimes in the right pelvis and sometimes in the left pelvis. It is worse when she lays down at night. She has not had any associated vaginal bleeding, change in vaginal discharge, or gush of fluid. She has not had any systemic symptoms including nausea, vomiting, diarrhea or constipation, no dysuria. Her pain goes away with Tylenol. She has been seen at [facility] during this pregnancy and is being referred to [facility].   History reviewed. No pertinent past medical history.  There are no active problems to display for this patient.   Past Surgical History:  Procedure Laterality Date  . CESAREAN SECTION    . CHOLECYSTECTOMY      Current Outpatient Rx  . Order #: [id number] Class: Print  . Order #: [id number] Class: Historical Med  . Order #: [id number] Class: Print  . Order #: [id number] Class: Print  . Order #: [id number] Class: Print    Allergies Patient has no known allergies.  Family History  Problem Relation Age of Onset  . Diabetes Other     Social History Social History  Substance Use Topics  . Smoking status: Never Smoker  . Smokeless tobacco: Never Used  . Alcohol use No    Review of Systems Constitutional: No fever/chills.No lightheadedness or syncope. Eyes: No visual changes. ENT: No sore throat. No congestion or rhinorrhea. Cardiovascular: Denies chest pain. Denies palpitations. Respiratory: Denies shortness of breath.  No cough. Gastrointestinal: Positive left and right  abdominal pain.  No nausea, no vomiting.  No diarrhea.  No constipation. Genitourinary: Negative for dysuria. No change in vaginal discharge. No vaginal bleeding. Musculoskeletal: Negative for back pain. Skin: Negative for rash. Neurological: Negative for headaches. No focal numbness, tingling or weakness.   10-point ROS otherwise negative.  ____________________________________________   PHYSICAL EXAM:  VITAL SIGNS: ED Triage Vitals  Enc Vitals Group     BP 11/15/16 1014 125/78     Pulse Rate 11/15/16 1014 86     Resp 11/15/16 1014 17     Temp 11/15/16 1014 97.8 F (36.6 C)     Temp Source 11/15/16 1014 Oral     SpO2 11/15/16 1014 100 %     Weight 11/15/16 1015 167 lb (75.8 kg)     Height 11/15/16 1015 5\' 2"  (1.575 m)     Head Circumference --      Peak Flow --      Pain Score 11/15/16 1015 5     Pain Loc --      Pain Edu? --      Excl. in GC? --     Constitutional: Alert and oriented. Well appearing and in no acute distress. Answers questions appropriately. Eyes: Conjunctivae are normal.  EOMI. No scleral icterus. Head: Atraumatic. Nose: No congestion/rhinnorhea. Mouth/Throat: Mucous membranes are moist.  Neck: No stridor.  Supple.   Cardiovascular: Normal rate, regular rhythm. No murmurs, rubs or gallops.  Respiratory: Normal respiratory effort.  No accessory muscle use or retractions. Lungs CTAB.  No wheezes, rales or ronchi. Gastrointestinal:Obese. Soft, nontender and nondistended.  No guarding  or rebound.  No peritoneal signs. Genitourinary: Normal-appearing external genitalia without lesions. Vaginal exam with Thick yellow greenish discharge, normal-appearing cervix, normal vaginal wall tissue. Bimanual exam is negative for CMT, adnexal tenderness to palpation, no palpable masses.  Cervix is closed.  Musculoskeletal: No LE edema.  Neurologic:  A&Ox3.  Speech is clear.  Face and smile are symmetric.  EOMI.  Moves all extremities well. Skin:  Skin is warm, dry and  intact. No rash noted. Psychiatric: Mood and affect are normal. Speech and behavior are normal.  Normal judgement.  ____________________________________________   LABS (all labs ordered are listed, but only abnormal results are displayed)  Labs Reviewed  WET PREP, GENITAL - Abnormal; Notable for the following:       Result Value   WBC, Wet Prep HPF POC MANY (*)    All other components within normal limits  HCG, QUANTITATIVE, PREGNANCY - Abnormal; Notable for the following:    hCG, Beta Chain, Quant, S 4,319 (*)    All other components within normal limits  URINALYSIS, COMPLETE (UACMP) WITH MICROSCOPIC - Abnormal; Notable for the following:    Color, Urine YELLOW (*)    APPearance CLEAR (*)    Ketones, ur 20 (*)    Squamous Epithelial / LPF 0-5 (*)    All other components within normal limits  CHLAMYDIA/NGC RT PCR ([facility] ONLY)  CBC  BASIC METABOLIC PANEL   ____________________________________________  EKG  Not indicated ____________________________________________  RADIOLOGY  [place] Ob Comp Less 14 Wks  Result Date: 11/15/2016 CLINICAL DATA:  Pregnant patient with abdominal pain. EXAM: OBSTETRIC <14 WK US AND TRANSVAGINAL OB US TECHNIQUE: Both transabdominal and transvaginal ultrasound examinations were performed for complete evaluation of the gestation as well as the maternal uterus, adnexal regions, and pelvic cul-de-sac. Transvaginal technique was performed to assess early pregnancy. COMPARISON:  None. FINDINGS: Intrauterine gestational sac: A small fluid collection with a mean diameter of 5.5 mm is seen in the endometrial canal, likely an early gestational sac. Yolk sac:  Not Visualized. Embryo:  Not Visualized. Cardiac Activity: Not Visualized. Heart Rate:   bpm MSD: 5.5  mm   5 w   2  d CRL:    mm    w    d                  US EDC: Subchorionic hemorrhage:  None visualized. Maternal uterus/adnexae: Probable corpus luteum cyst in the right ovary. Trace fluid in the  pelvis is likely physiologic. IMPRESSION: 1. The findings are most consistent with an early gestational sac and a corpus luteum cyst in the right ovary. The possibility of a pseudo gestational sac is considered less likely. Recommend clinical correlation and short-term follow-up. Electronically Signed   By: Gerome Samavid  Williams III M.D   On: 11/15/2016 12:53   [place] Ob Transvaginal  Result Date: 11/15/2016 CLINICAL DATA:  Pregnant patient with abdominal pain. EXAM: OBSTETRIC <14 WK US AND TRANSVAGINAL OB US TECHNIQUE: Both transabdominal and transvaginal ultrasound examinations were performed for complete evaluation of the gestation as well as the maternal uterus, adnexal regions, and pelvic cul-de-sac. Transvaginal technique was performed to assess early pregnancy. COMPARISON:  None. FINDINGS: Intrauterine gestational sac: A small fluid collection with a mean diameter of 5.5 mm is seen in the endometrial canal, likely an early gestational sac. Yolk sac:  Not Visualized. Embryo:  Not Visualized. Cardiac Activity: Not Visualized. Heart Rate:   bpm MSD: 5.5  mm   5 w   2  d CRL:    mm    w    d                  US EDC: Subchorionic hemorrhage:  None visualized. Maternal uterus/adnexae: Probable corpus luteum cyst in the right ovary. Trace fluid in the pelvis is likely physiologic. IMPRESSION: 1. The findings are most consistent with an early gestational sac and a corpus luteum cyst in the right ovary. The possibility of a pseudo gestational sac is considered less likely. Recommend clinical correlation and short-term follow-up. Electronically Signed   By: Gerome Samavid  Williams III M.D   On: 11/15/2016 12:53    ____________________________________________   PROCEDURES  Procedure(s) performed: None  Procedures  Critical Care performed: No ____________________________________________   INITIAL IMPRESSION / ASSESSMENT AND PLAN / ED COURSE  Pertinent labs & imaging results that were available during my care of the  patient were reviewed by me and considered in my medical decision making (see chart for details).  30 y.o. female, G3 P1 A1 approximately [redacted] weeks pregnant presenting with intermittent lower abdominal and pelvic pressure without any other associated symptoms. Overall, the patient has reassuring signs. She is not yet had an ultrasound, so we will perform that in the emergency department. I will check her for vaginal infection, UTI, or any other abnormalities in her blood. If her workup in the emergency department is reassuring, I'll plan to discharge her home with close OB/GYN follow-up.  ----------------------------------------- 1:17 PM on 11/15/2016 -----------------------------------------  Patient's workup in the emergency department is reassuring. Her ultrasound shows a gestational sac intrauterine. She does not have any abnormalities in her workup prep other than white blood cells. She does not have a UTI. I'll plan to discharge the patient home with close OB/GYN follow-up. She understands return precautions.  ____________________________________________  FINAL CLINICAL IMPRESSION(S) / ED DIAGNOSES  Final diagnoses:  Pelvic pressure in pregnancy, antepartum, first trimester    Clinical Course       NEW MEDICATIONS STARTED DURING THIS VISIT:  New Prescriptions   No medications on file      Rockne MenghiniAnne-Caroline Timotheus Salm, MD 11/15/16 1318

## 2016-11-15 NOTE — ED Notes (Signed)

## 2016-12-07 NOTE — L&D Delivery Note (Signed)
[id number]- Discharge instructions given pt and family verbalized understanding. Pt reminded to keep next appt with provider as scheduled.   No needs voiced at this time.     0015 Pt discharged home ambulatory in stable conditions accompanied by family

## 2016-12-07 NOTE — L&D Delivery Note (Signed)
Pt transferred to LDR 3 in stable conditions accompanied by RN and SO

## 2016-12-07 NOTE — L&D Delivery Note (Addendum)
Delivery Note At  0014 a viable and healthy female "Natasha Myers" was delivered via  (Presentation:OA after manual rotation from OP with Indicated low forceps per Dr Greggory Keen ;  ).  APGAR: 8/9  .   Placenta status: delivered intact with 3 vessel Cord:  with the following complications: maternal exhaustion, thick MSAF  Anesthesia:  epidural Episiotomy:  none Lacerations:  2nd Suture Repair: 3.0 vicryl rapide Est. Blood Loss (mL):  300  Mom to postpartum.  Baby to Couplet care / Skin to Skin.  Melody N Shambley 07/24/2017, 12:31 AM   MD Note: TOLAC notable for Pitocin augmentation, AROM with Light Meconium. Maximal Pitocin augmentation (20 miu/min) enabled pt to get to C/C/+1/OP. 2.5 hour second stage was characterized by variable decelerations, maintaining good variability, but subsequently developing Thick Meconium. Operative Vaginal Delivery facilitated successful VBAC. Persistent OP and Maternal exhaustion was managed with Manual rotation from OP to ROA, followed by ILFD, <45 degree rotation, from C/C/+2 to +3, using Simpson Forceps with Bill's Axis traction Bar, over a second degree tear. Tear was repaired by Harlow Mares. Infant was vigorous at birth; Arterial cord pH was sent; NICU resuscitation team was present at delivery.  Herold Harms, MD

## 2016-12-07 NOTE — L&D Delivery Note (Signed)
1915  Pt to L&D with c/o N/V.  Pt to bathroom to change into hospital gown.  EFM explained and applied at this time.   [id number]  M. Shambley CNM present on unit and informed about pt status.  Will place orders at this time

## 2017-01-04 LAB — HM PAP SMEAR

## 2017-01-23 ENCOUNTER — Encounter: Payer: Self-pay | Admitting: Emergency Medicine

## 2017-01-23 ENCOUNTER — Emergency Department
Admission: EM | Admit: 2017-01-23 | Discharge: 2017-01-23 | Disposition: A | Discharge status: Home / Self Care | Payer: Medicaid Other | Attending: Emergency Medicine | Admitting: Emergency Medicine

## 2017-01-23 ENCOUNTER — Emergency Department: Payer: Medicaid Other

## 2017-01-23 DIAGNOSIS — O219 Vomiting of pregnancy, unspecified: Secondary | ICD-10-CM | POA: Insufficient documentation

## 2017-01-23 DIAGNOSIS — Z3A15 15 weeks gestation of pregnancy: Secondary | ICD-10-CM | POA: Diagnosis not present

## 2017-01-23 DIAGNOSIS — R109 Unspecified abdominal pain: Secondary | ICD-10-CM

## 2017-01-23 DIAGNOSIS — Z3492 Encounter for supervision of normal pregnancy, unspecified, second trimester: Secondary | ICD-10-CM

## 2017-01-23 DIAGNOSIS — R102 Pelvic and perineal pain: Secondary | ICD-10-CM | POA: Insufficient documentation

## 2017-01-23 DIAGNOSIS — R1012 Left upper quadrant pain: Secondary | ICD-10-CM

## 2017-01-23 DIAGNOSIS — O26892 Other specified pregnancy related conditions, second trimester: Secondary | ICD-10-CM | POA: Diagnosis present

## 2017-01-23 LAB — COMPREHENSIVE METABOLIC PANEL
ALT: 31 U/L (ref 14–54)
AST: 25 U/L (ref 15–41)
Albumin: 3.7 g/dL (ref 3.5–5.0)
Alkaline Phosphatase: 62 U/L (ref 38–126)
Anion gap: 10 (ref 5–15)
BILIRUBIN TOTAL: 0.6 mg/dL (ref 0.3–1.2)
BUN: 6 mg/dL (ref 6–20)
CO2: 22 mmol/L (ref 22–32)
Calcium: 9 mg/dL (ref 8.9–10.3)
Chloride: 102 mmol/L (ref 101–111)
Creatinine, Ser: 0.69 mg/dL (ref 0.44–1.00)
GFR calc Af Amer: >60 mL/min (ref >60)
Glucose, Bld: 90 mg/dL (ref 65–99)
Potassium: 3.5 mmol/L (ref 3.5–5.1)
SODIUM: 134 mmol/L — AB (ref 135–145)
TOTAL PROTEIN: 7.4 g/dL (ref 6.5–8.1)

## 2017-01-23 LAB — CBC
HEMATOCRIT: 35.4 % (ref 35.0–47.0)
Hemoglobin: 12.2 g/dL (ref 12.0–16.0)
MCH: 32.5 pg (ref 26.0–34.0)
MCHC: 34.4 g/dL (ref 32.0–36.0)
MCV: 94.5 fL (ref 80.0–100.0)
PLATELETS: 284 10*3/uL (ref 150–440)
RBC: 3.75 MIL/uL — ABNORMAL LOW (ref 3.80–5.20)
RDW: 13.4 % (ref 11.5–14.5)
WBC: 8.1 10*3/uL (ref 3.6–11.0)

## 2017-01-23 LAB — URINALYSIS, COMPLETE (UACMP) WITH MICROSCOPIC
BACTERIA UA: NONE SEEN
BILIRUBIN URINE: NEGATIVE
GLUCOSE, UA: NEGATIVE mg/dL
HGB URINE DIPSTICK: NEGATIVE
Ketones, ur: 80 mg/dL — AB
LEUKOCYTES UA: NEGATIVE
NITRITE: NEGATIVE
PH: 6 (ref 5.0–8.0)
Protein, ur: 30 mg/dL — AB
SPECIFIC GRAVITY, URINE: 1.023 (ref 1.005–1.030)

## 2017-01-23 LAB — LIPASE, BLOOD: LIPASE: 27 U/L (ref 11–51)

## 2017-01-23 LAB — ABO/RH: ABO/RH(D): B POS

## 2017-01-23 LAB — HCG, QUANTITATIVE, PREGNANCY: hCG, Beta Chain, Quant, S: 45388 m[IU]/mL — ABNORMAL HIGH (ref <5)

## 2017-01-23 MED ORDER — RANITIDINE HCL 150 MG/10ML PO SYRP
75.0000 mg | ORAL_SOLUTION | Freq: Once | ORAL | Status: AC
  Administered 2017-01-23: 75 mg via ORAL
  Filled 2017-01-23: qty 10

## 2017-01-23 MED ORDER — ACETAMINOPHEN 500 MG PO TABS
1000.0000 mg | ORAL_TABLET | ORAL | Status: AC
  Administered 2017-01-23: 1000 mg via ORAL
  Filled 2017-01-23: qty 2

## 2017-01-23 MED ORDER — SODIUM CHLORIDE 0.9 % IV BOLUS (SEPSIS)
1000.0000 mL | Freq: Once | INTRAVENOUS | Status: AC
  Administered 2017-01-23: 1000 mL via INTRAVENOUS

## 2017-01-23 MED ORDER — ONDANSETRON 4 MG PO TBDP: 4.0000 mg | ORAL_TABLET | Freq: Four times a day (QID) | ORAL | 0 refills | Status: DC | PRN

## 2017-01-23 MED ORDER — ONDANSETRON HCL 4 MG/2ML IJ SOLN
4.0000 mg | Freq: Once | INTRAMUSCULAR | Status: AC
  Administered 2017-01-23: 4 mg via INTRAVENOUS
  Filled 2017-01-23: qty 2

## 2017-01-23 MED ORDER — ONDANSETRON 4 MG PO TBDP
4.0000 mg | ORAL_TABLET | Freq: Once | ORAL | Status: AC | PRN
  Administered 2017-01-23: 4 mg via ORAL
  Filled 2017-01-23: qty 1

## 2017-01-23 MED ORDER — ALUM & MAG HYDROXIDE-SIMETH 200-200-20 MG/5ML PO SUSP
15.0000 mL | Freq: Once | ORAL | Status: AC
  Administered 2017-01-23: 15 mL via ORAL
  Filled 2017-01-23: qty 30

## 2017-01-23 NOTE — ED Triage Notes (Signed)
Patient reports c/o upper mid to left upper abd pain since this am. Also reports fairly constant vomiting since 0130 this am. Patient is currently pregnant (14 weeks) and has h/o hyperemesis with last pregnancy. States she has had good amount of vomiting this pregnancy as well, but as not as much as today. Denies any complications for this pregnancy.

## 2017-01-23 NOTE — ED Provider Notes (Signed)
[facility] Emergency Department Provider Note   ____________________________________________   First MD Initiated Contact with Patient 01/23/17 1430     (approximate)  I have reviewed the triage vital signs and the nursing notes.   HISTORY  Chief Complaint Abdominal Pain and Emesis    HPI Natasha Myers is a 31 y.o. female here for evaluation of upper abdominal pain. Patient reports about [redacted] weeks pregnant, she's been having frequent nausea and vomiting throughout the pregnancy, vomiting almost daily currently on Diclegis. This morning she began vomiting about 2 AM, she's had multiple episodes of vomiting and reports that after that she did experience a rather sharp pain over the very upper abdomen to left upper abdomen, made worse by nausea and vomiting. No bloody emesis. No fevers or chills. No pain in the right upper or right lower quadrant. No vaginal bleeding or loss of fluid. Reports no pain in the "pelvic" region  No chest pain or shortness of breath.  Follows with [facility], reports that she had a first trimester screening including ultrasound which she was told was normal recently  Took Zofran during her first pregnancy, she has been pregnant total one time had a previous hysterectomy. Bowels moving normally. No constipation.  History reviewed. No pertinent past medical history.  There are no active problems to display for this patient.   Past Surgical History:  Procedure Laterality Date  . CESAREAN SECTION    . CHOLECYSTECTOMY      Prior to Admission medications   Medication Sig Start Date End Date Taking? Authorizing Provider  benzonatate (TESSALON) 200 MG capsule Take 1 capsule (200 mg total) by mouth 3 (three) times daily as needed for cough. 11/30/12   Burgess Amor, PA-C  esomeprazole (NEXIUM) 20 MG capsule Take 20 mg by mouth 2 (two) times daily.    Historical Provider, MD  ibuprofen (ADVIL,MOTRIN) 600 MG tablet Take 1 tablet (600  mg total) by mouth every 6 (six) hours as needed for moderate pain. 05/11/15   Gayla Doss, MD  ondansetron (ZOFRAN ODT) 4 MG disintegrating tablet Take 1 tablet (4 mg total) by mouth every 6 (six) hours as needed for nausea or vomiting. 01/23/17   Sharyn Creamer, MD  ranitidine (ZANTAC) 150 MG capsule Take 1 capsule (150 mg total) by mouth 2 (two) times daily. 11/27/15   Sharman Cheek, MD  sucralfate (CARAFATE) 1 G tablet Take 1 tablet (1 g total) by mouth 4 (four) times daily. 11/27/15   Sharman Cheek, MD    Allergies Patient has no known allergies.  Family History  Problem Relation Age of Onset  . Diabetes Other     Social History Social History  Substance Use Topics  . Smoking status: Never Smoker  . Smokeless tobacco: Never Used  . Alcohol use No    Review of Systems Constitutional: No fever/chills Eyes: No visual changes. ENT: No sore throat. Cardiovascular: Denies chest pain. Respiratory: Denies shortness of breath. Gastrointestinal:   No diarrhea.  No constipation. Genitourinary: Negative for dysuria. Musculoskeletal: Negative for back pain. Skin: Negative for rash. Neurological: Negative for headaches, focal weakness or numbness.  10-point ROS otherwise negative.  ____________________________________________   PHYSICAL EXAM:  VITAL SIGNS: ED Triage Vitals  Enc Vitals Group     BP 01/23/17 1201 113/66     Pulse Rate 01/23/17 1201 (!) 105     Resp 01/23/17 1201 18     Temp 01/23/17 1201 98 F (36.7 C)  Temp Source 01/23/17 1201 Oral     SpO2 01/23/17 1201 99 %     Weight 01/23/17 1158 160 lb (72.6 kg)     Height 01/23/17 1158 5\' 1"  (1.549 m)     Head Circumference --      Peak Flow --      Pain Score 01/23/17 1158 8     Pain Loc --      Pain Edu? --      Excl. in GC? --     Constitutional: Alert and oriented. Well appearing and in no acute distressThough she is holding emesis basin and appears nauseated. Eyes: Conjunctivae are normal. PERRL.  EOMI. Head: Atraumatic. Nose: No congestion/rhinnorhea. Mouth/Throat: Mucous membranes are moist.  Oropharynx non-erythematous. Neck: No stridor.   Cardiovascular: Minimally tachycardic rate, regular rhythm. Grossly normal heart sounds.  Good peripheral circulation. Respiratory: Normal respiratory effort.  No retractions. Lungs CTAB. Gastrointestinal: Soft and nontender in the right lower quadrant, right upper quadrant, and left lower quadrant. She does report moderate discomfort in the left upper quadrant and mild discomfort in the epigastrium. No rebound or guarding. No tenderness overlying the uterine fundus, which is palpable about 3-4 cm above the pelvic brim. No distention. No abdominal bruits. No CVA tenderness. Musculoskeletal: No lower extremity tenderness nor edema.   Neurologic:  Normal speech and language. No gross focal neurologic deficits are appreciated.Skin:  Skin is warm, dry and intact. No rash noted. Psychiatric: Mood and affect are normal. Speech and behavior are normal.  ____________________________________________   LABS (all labs ordered are listed, but only abnormal results are displayed)  Labs Reviewed  COMPREHENSIVE METABOLIC PANEL - Abnormal; Notable for the following:       Result Value   Sodium 134 (*)    All other components within normal limits  CBC - Abnormal; Notable for the following:    RBC 3.75 (*)    All other components within normal limits  URINALYSIS, COMPLETE (UACMP) WITH MICROSCOPIC - Abnormal; Notable for the following:    Color, Urine YELLOW (*)    APPearance CLEAR (*)    Ketones, ur 80 (*)    Protein, ur 30 (*)    Squamous Epithelial / LPF 0-5 (*)    All other components within normal limits  HCG, QUANTITATIVE, PREGNANCY - Abnormal; Notable for the following:    hCG, Beta Chain, Sharene ButtersQuant, [place] [phone] (*)    All other components within normal limits  LIPASE, BLOOD  ABO/RH   ____________________________________________  EKG  Reviewed and  interpreted by me at 12:10 PM Ventricular rate 110 QRS 70 QTc 420 Normal sinus rhythm, nonspecific T-wave abnormality in inferior leads  Compared with the patient's previous EKG from August 26 of 2013, the inferior T wave abnormality seen today appear old ____________________________________________  RADIOLOGY  [place] Abdomen Complete  Result Date: 01/23/2017 CLINICAL DATA:  Abdominal discomfort. EXAM: ABDOMEN ULTRASOUND COMPLETE COMPARISON:  02/16/2013 abdominal CT FINDINGS: Gallbladder: No gallstones or wall thickening visualized. No sonographic Murphy sign noted by sonographer. Common bile duct: Diameter: 3 mm Where visualized, no filling defect. Liver: No focal lesion identified. Within normal limits in parenchymal echogenicity. Antegrade flow in the imaged hepatic and portal venous system. IVC: No abnormality visualized. Pancreas: Visualized portion unremarkable. Spleen: Size and appearance within normal limits. Right Kidney: Length: 11.6 cm. Echogenicity within normal limits. No mass or hydronephrosis visualized. Left Kidney: Length: 11 cm. Echogenicity within normal limits. No mass or hydronephrosis visualized. Abdominal aorta: No aneurysm visualized. Other findings: None. IMPRESSION:  Negative exam.  No explanation for pain. Electronically Signed   By: Marnee Spring M.D.   On: 01/23/2017 15:47   US Ob Limited  Result Date: 01/23/2017 CLINICAL DATA:  Pelvic pain EXAM: LIMITED OBSTETRIC ULTRASOUND FINDINGS: Number of Fetuses: 1 Heart Rate:  169 bpm Movement: Visualized Presentation: Breech Placental Location: Posterior Previa: No demonstrable placental previa. Placenta is somewhat low-lying with the distance from the placenta edge to the cervical os of 1.7 cm. No placental abruption seen. Note that contraction noted. The course of the study in the region of the placenta posteriorly. Amniotic Fluid (Subjective): Within normal limits for gestational age. BPD:  2.9cm 15w  2d MATERNAL FINDINGS:  Cervix:  Appears closed. Uterus/Adnexae: No abnormality visualized. Apparent 1.8 x 1.7 x 1.6 cm corpus luteum right ovary, a physiologic. No free pelvic fluid. IMPRESSION: Single live intrauterine gestation estimated gestational age of 15+ weeks. Placenta low-lying without frank previa. No placental abruption demonstrated. Amniotic fluid volume felt to be within normal limits for gestational age. Cervical os closed. This exam is performed on an emergent basis to address a specific clinical concern and does not comprehensively evaluate fetal size, dating, or anatomy; follow-up complete OB US should be considered on a nonemergent basis if further fetal assessment is warranted. Electronically Signed   By: Bretta Bang III M.D.   On: 01/23/2017 15:45    ____________________________________________   PROCEDURES  Procedure(s) performed: None  Procedures  Critical Care performed: No  ____________________________________________   INITIAL IMPRESSION / ASSESSMENT AND PLAN / ED COURSE  Pertinent labs & imaging results that were available during my care of the patient were reviewed by me and considered in my medical decision making (see chart for details).    Risks and benefits of Zofran discussed with the patient, she is agreeable with plan to initiate this understanding a very low, but potential risks for congenital abnormality such as cleft palate, less likely in the second and third trimester's.  Vomiting, nausea, and left upper quadrant focal discomfort. Symptomatology sounds like hyperemesis gravidarum, possibly with associated gastritis or other peptic etiology of pain such as ulceration, gastritis. She has previous cholecystectomy. No fever, normal white count, and no pain over the right secondary abdomen including the right lower quadrant. No pain at McBurney's point. I find appendicitis, acute pancreatitis, hepatitis, or peritonitis extremely unlikely. No gynecologic  symptoms.  Discussed with the patient plan of care, will attempt hydration, antiemetics, treatment with Zantac, and reevaluate after ultrasound.    Clinical Course as of Jan 23 1699  Sat Jan 23, 2017  1556 Reevaluated patient. She reports she is feeling improved. No further vomiting, reports nausea much better. On reexam minimal tenderness the left upper quadrant, none on the right side occluded right upper quadrant or right lower quadrant.  [MQ]    Clinical Course User Index [MQ] Sharyn Creamer, MD    ----------------------------------------- 5:00 PM on 01/23/2017 -----------------------------------------  Patient tolerating fluids now well, has had 2 glasses of water. Reports her pain is gone but feels as just slight discomfort in the left upper abdomen. Repeat exam soft nontender nondistended, and appears much improved. Discussed careful return precautions, including returning immediately if increasing pain, bloody vomit, a fever, pain on the right side or right lower abdomen, vaginal bleeding or discharge or other concerns arise. She is very agreeable.  Prescribe Zofran ODT, and discussed risks and benefits about this medication especially given her pregnancy status, patient is agreeable. ____________________________________________   FINAL CLINICAL IMPRESSION(S) / ED DIAGNOSES  Final diagnoses:  Abdominal discomfort  Second trimester pregnancy  LUQ abdominal pain  Vomiting during pregnancy      NEW MEDICATIONS STARTED DURING THIS VISIT:  New Prescriptions   ONDANSETRON (ZOFRAN ODT) 4 MG DISINTEGRATING TABLET    Take 1 tablet (4 mg total) by mouth every 6 (six) hours as needed for nausea or vomiting.     Note:  This document was prepared using Dragon voice recognition software and may include unintentional dictation errors.     Sharyn Creamer, MD 01/23/17 [phone]

## 2017-01-23 NOTE — Discharge Instructions (Signed)
Please return to the emergency room right away if you are to develop a fever, severe nausea, your pain becomes severe or worsens, you have vaginal bleeding or discharge or leak fluid, you are unable to keep food down, begin vomiting any dark or bloody fluid, you develop any dark or bloody stools, feel dehydrated, or other new concerns or symptoms arise.

## 2017-01-27 ENCOUNTER — Encounter: Payer: Self-pay | Admitting: Certified Nurse Midwife

## 2017-01-27 ENCOUNTER — Ambulatory Visit (INDEPENDENT_AMBULATORY_CARE_PROVIDER_SITE_OTHER): Payer: Medicaid Other | Admitting: Certified Nurse Midwife

## 2017-01-27 VITALS — BP 107/68 | HR 83 | Wt 165.5 lb

## 2017-01-27 DIAGNOSIS — Z3490 Encounter for supervision of normal pregnancy, unspecified, unspecified trimester: Secondary | ICD-10-CM | POA: Insufficient documentation

## 2017-01-27 DIAGNOSIS — Z3A15 15 weeks gestation of pregnancy: Secondary | ICD-10-CM

## 2017-01-27 DIAGNOSIS — Z3482 Encounter for supervision of other normal pregnancy, second trimester: Secondary | ICD-10-CM

## 2017-01-27 DIAGNOSIS — Z1389 Encounter for screening for other disorder: Secondary | ICD-10-CM | POA: Diagnosis not present

## 2017-01-27 LAB — POCT URINALYSIS DIPSTICK
BILIRUBIN UA: NEGATIVE
GLUCOSE UA: NEGATIVE
Leukocytes, UA: NEGATIVE
NITRITE UA: NEGATIVE
PH UA: 6.5
RBC UA: NEGATIVE
SPEC GRAV UA: 1.02
Urobilinogen, UA: NEGATIVE

## 2017-01-27 NOTE — Progress Notes (Signed)
[facility]. Pt has signed medical release to have records sent over on Monday. G-2, P-1001, C/S. Pt states she has had first trimester screen and it was normal. Flu vaccine also had 08/2016. Seen in ER on 2/17/218 due to dehydration. Ultrasound done resulting in viable pregnancy at 14.5 wks. Pt states her blood type is B+. Also has [facility]. Last pap done at [facility] visit and [facility] labs.

## 2017-01-27 NOTE — Progress Notes (Signed)
NEW OB HISTORY AND PHYSICAL  SUBJECTIVE:       Natasha Myers is a 31 y.o. G39P1001 female, Patient's last menstrual period was 10/12/2016., Estimated Date of Delivery: 07/19/17., presents today for establishment of Prenatal Care. She is a transfer from [facility] She complain of nausea, she has a prescription from [facility] for Diclegis.    Gynecologic History Patient's last menstrual period was 10/12/2016. Normal Contraception: none Last Pap: need reports from [facility].  Previous cesarean section   Obstetric History OB History  Gravida Para Term Preterm AB Living  2 1 1     1   SAB TAB Ectopic Multiple Live Births               # Outcome Date GA Lbr Len/2nd Weight Sex Delivery Anes PTL Lv  2 Current           1 Term               Past Medical History:  Diagnosis Date  . GERD (gastroesophageal reflux disease)     Past Surgical History:  Procedure Laterality Date  . CESAREAN SECTION    . CHOLECYSTECTOMY      Current Outpatient Prescriptions on File Prior to Visit  Medication Sig Dispense Refill  . ondansetron (ZOFRAN ODT) 4 MG disintegrating tablet Take 1 tablet (4 mg total) by mouth every 6 (six) hours as needed for nausea or vomiting. 20 tablet 0  . ranitidine (ZANTAC) 150 MG capsule Take 1 capsule (150 mg total) by mouth 2 (two) times daily. 28 capsule 0   No current facility-administered medications on file prior to visit.     No Known Allergies  Social History   Social History  . Marital status: Single    Spouse name: N/A  . Number of children: N/A  . Years of education: N/A   Occupational History  . Not on file.   Social History Main Topics  . Smoking status: Never Smoker  . Smokeless tobacco: Never Used  . Alcohol use No  . Drug use: No  . Sexual activity: Yes    Birth control/ protection: None   Other Topics Concern  . Not on file   Social History Narrative  . No narrative on file    Family History  Problem Relation Age of  Onset  . Diabetes Other     The following portions of the patient's history were reviewed and updated as appropriate: allergies, current medications, past OB history, past medical history, past surgical history, past family history, past social history, and problem list.    OBJECTIVE: Initial Physical Exam (New OB)  GENERAL APPEARANCE: alert, well appearing, in no apparent distress HEAD: normocephalic, atraumatic MOUTH: mucous membranes moist THYROID: no thyromegaly or masses present BREASTS: no masses noted, no significant tenderness LUNGS: clear to auscultation, symmetric air entry HEART: regular rate and rhythm, no murmurs ABDOMEN: soft, nontender, nondistended, no abnormal masses, no epigastric pain, obese  EXTREMITIES: no redness or tenderness in the calves or thighs SKIN: normal coloration and turgor, no rashes NEUROLOGIC: alert, oriented, normal speech, no focal findings or movement disorder noted  PELVIC EXAM : deferred, pt had pelvic exam completed with pap & STD testing at [facility].  ASSESSMENT: Normal pregnancy at 15wks 2 days  PLAN: Prenatal care New OB counseling: The patient has been given an overview regarding routine prenatal care. Recommendations regarding diet, weight gain, medications, and exercise in pregnancy were given. Prenatal testing, optional genetic testing, and  ultrasound use in pregnancy were reviewed. Reviewed vaginal delivery vs. Cesarean section.  Benefits of Breast Feeding were discussed. The patient is encouraged to consider nursing her baby post partum. ROB in 4-[redacted] wks along with anatomy ultrasound.   Doreene BurkeAnnie Crystalann Korf, CNM

## 2017-01-27 NOTE — Patient Instructions (Signed)
Vaginal Birth After Cesarean Delivery Vaginal birth after cesarean delivery (VBAC) is giving birth vaginally after previously delivering a baby by a cesarean. In the past, if a woman had a cesarean delivery, all births afterward would be done by cesarean delivery. This is no longer true. It can be safe for the mother to try a vaginal delivery after having a cesarean delivery. It is important to discuss VBAC with your health care provider early in the pregnancy so you can understand the risks, benefits, and options. It will give you time to decide what is best in your particular case. The final decision about whether to have a VBAC or repeat cesarean delivery should be between you and your health care provider. Any changes in your health or your baby's health during your pregnancy may make it necessary to change your initial decision about VBAC. Women who plan to have a VBAC should check with their health care provider to be sure that:  The previous cesarean delivery was done with a low transverse uterine cut (incision) (not a vertical classical incision).  The birth canal is big enough for the baby.  There were no other operations on the uterus.  An electronic fetal monitor (EFM) will be on at all times during labor.  An operating room will be available and ready in case an emergency cesarean delivery is needed.  A health care provider and surgical nursing staff will be available at all times during labor to be ready to do an emergency delivery cesarean if necessary.  An anesthesiologist will be present in case an emergency cesarean delivery is needed.  The nursery is prepared and has adequate personnel and necessary equipment available to care for the baby in case of an emergency cesarean delivery. Benefits of VBAC  Shorter stay in the hospital.  Avoidance of risks associated with cesarean delivery, such as:  Surgical complications, such as opening of the incision or hernia in the  incision.  Injury to other organs.  Fever. This can occur if an infection develops after surgery. It can also occur as a reaction to the medicine given to make you numb during the surgery.  Less blood loss and need for blood transfusions.  Lower risk of blood clots and infection.  Shorter recovery.  Decreased risk for having to remove the uterus (hysterectomy).  Decreased risk for the placenta to completely or partially cover the opening of the uterus (placenta previa) with a future pregnancy.  Decrease risk in future labor and delivery. Risks of a VBAC  Tearing (rupture) of the uterus. This is occurs in less than 1% of VBACs. The risk of this happening is higher if:  Steps are taken to begin the labor process (induce labor) or stimulate or strengthen contractions (augment labor).  Medicine is used to soften (ripen) the cervix.  Having to remove the uterus (hysterectomy) if it ruptures. VBAC should not be done if:  The previous cesarean delivery was done with a vertical (classical) or T-shaped incision or you do not know what kind of incision was made.  You had a ruptured uterus.  You have had certain types of surgery on your uterus, such as removal of uterine fibroids. Ask your health care provider about other types of surgeries that prevent you from having a VBAC.  You have certain medical or childbirth (obstetrical) problems.  There are problems with the baby.  You have had two previous cesarean deliveries and no vaginal deliveries. Other facts to know about VBAC:  It   is safe to have an epidural anesthetic with VBAC.  It is safe to turn the baby from a breech position (attempt an external cephalic version).  It is safe to try a VBAC with twins.  VBAC may not be successful if your baby weights 8.8 lb (4 kg) or more. However, weight predictions are not always accurate and should not be used alone to decide if VBAC is right for you.  There is an increased failure rate  if the time between the cesarean delivery and VBAC is less than 19 months.  Your health care provider may advise against a VBAC if you have preeclampsia (high blood pressure, protein in the urine, and swelling of face and extremities).  VBAC is often successful if you previously gave birth vaginally.  VBAC is often successful when the labor starts spontaneously before the due date.  Delivering a baby through a VBAC is similar to having a normal spontaneous vaginal delivery. This information is not intended to replace advice given to you by your health care provider. Make sure you discuss any questions you have with your health care provider. Document Released: 05/16/2007 Document Revised: 04/30/2016 Document Reviewed: 06/22/2013 Elsevier Interactive Patient Education  2017 [employer]   Prenatal Care WHAT IS PRENATAL CARE? Prenatal care is the process of caring for a pregnant woman before she gives birth. Prenatal care makes sure that she and her baby remain as healthy as possible throughout pregnancy. Prenatal care may be provided by a midwife, family practice health care provider, or a childbirth and pregnancy specialist (obstetrician). Prenatal care may include physical examinations, testing, treatments, and education on nutrition, lifestyle, and social support services. WHY IS PRENATAL CARE SO IMPORTANT? Early and consistent prenatal care increases the chance that you and your baby will remain healthy throughout your pregnancy. This type of care also decreases a baby's risk of being born too early (prematurely), or being born smaller than expected (small for gestational age). Any underlying medical conditions you may have that could pose a risk during your pregnancy are discussed during prenatal care visits. You will also be monitored regularly for any new conditions that may arise during your pregnancy so they can be treated quickly and effectively. WHAT HAPPENS DURING PRENATAL CARE  VISITS? Prenatal care visits may include the following: Discussion Tell your health care provider about any new signs or symptoms you have experienced since your last visit. These might include:  Nausea or vomiting.  Increased or decreased level of energy.  Difficulty sleeping.  Back or leg pain.  Weight changes.  Frequent urination.  Shortness of breath with physical activity.  Changes in your skin, such as the development of a rash or itchiness.  Vaginal discharge or bleeding.  Feelings of excitement or nervousness.  Changes in your baby's movements. You may want to write down any questions or topics you want to discuss with your health care provider and bring them with you to your appointment. Examination During your first prenatal care visit, you will likely have a complete physical exam. Your health care provider will often examine your vagina, cervix, and the position of your uterus, as well as check your heart, lungs, and other body systems. As your pregnancy progresses, your health care provider will measure the size of your uterus and your baby's position inside your uterus. He or she may also examine you for early signs of labor. Your prenatal visits may also include checking your blood pressure and, after about 10-12 weeks of pregnancy, listening  to your baby's heartbeat. Testing Regular testing often includes:  Urinalysis. This checks your urine for glucose, protein, or signs of infection.  Blood count. This checks the levels of white and red blood cells in your body.  Tests for sexually transmitted infections (STIs). Testing for STIs at the beginning of pregnancy is routinely done and is required in many states.  Antibody testing. You will be checked to see if you are immune to certain illnesses, such as rubella, that can affect a developing fetus.  Glucose screen. Around 24-28 weeks of pregnancy, your blood glucose level will be checked for signs of gestational  diabetes. Follow-up tests may be recommended.  Group B strep. This is a bacteria that is commonly found inside a woman's vagina. This test will inform your health care provider if you need an antibiotic to reduce the amount of this bacteria in your body prior to labor and childbirth.  Ultrasound. Many pregnant women undergo an ultrasound screening around 18-20 weeks of pregnancy to evaluate the health of the fetus and check for any developmental abnormalities.  HIV (human immunodeficiency virus) testing. Early in your pregnancy, you will be screened for HIV. If you are at high risk for HIV, this test may be repeated during your third trimester of pregnancy. You may be offered other testing based on your age, personal or family medical history, or other factors. HOW OFTEN SHOULD I PLAN TO SEE MY HEALTH CARE PROVIDER FOR PRENATAL CARE? Your prenatal care check-up schedule depends on any medical conditions you have before, or develop during, your pregnancy. If you do not have any underlying medical conditions, you will likely be seen for checkups:  Monthly, during the first 6 months of pregnancy.  Twice a month during months 7 and 8 of pregnancy.  Weekly starting in the 9th month of pregnancy and until delivery. If you develop signs of early labor or other concerning signs or symptoms, you may need to see your health care provider more often. Ask your health care provider what prenatal care schedule is best for you. WHAT CAN I DO TO KEEP MYSELF AND MY BABY AS HEALTHY AS POSSIBLE DURING MY PREGNANCY?  Take a prenatal vitamin containing 400 micrograms (0.4 mg) of folic acid every day. Your health care provider may also ask you to take additional vitamins such as iodine, vitamin D, iron, copper, and zinc.  Take 1500-2000 mg of calcium daily starting at your 20th week of pregnancy until you deliver your baby.  Make sure you are up to date on your vaccinations. Unless directed otherwise by your health  care provider:  You should receive a tetanus, diphtheria, and pertussis (Tdap) vaccination between the 27th and 36th week of your pregnancy, regardless of when your last Tdap immunization occurred. This helps protect your baby from whooping cough (pertussis) after he or she is born.  You should receive an annual inactivated influenza vaccine (IIV) to help protect you and your baby from influenza. This can be done at any point during your pregnancy.  Eat a well-rounded diet that includes:  Fresh fruits and vegetables.  Lean proteins.  Calcium-rich foods such as milk, yogurt, hard cheeses, and dark, leafy greens.  Whole grain breads.  Do noteat seafood high in mercury, including:  Swordfish.  Tilefish.  Shark.  King mackerel.  More than 6 oz tuna per week.  Do not eat:  Raw or undercooked meats or eggs.  Unpasteurized foods, such as soft cheeses (brie, blue, or feta), juices, and milks.  Lunch meats.  Hot dogs that have not been heated until they are steaming.  Drink enough water to keep your urine clear or pale yellow. For many women, this may be 10 or more 8 oz glasses of water each day. Keeping yourself hydrated helps deliver nutrients to your baby and may prevent the start of pre-term uterine contractions.  Do not use any tobacco products including cigarettes, chewing tobacco, or electronic cigarettes. If you need help quitting, ask your health care provider.  Do not drink beverages containing alcohol. No safe level of alcohol consumption during pregnancy has been determined.  Do not use any illegal drugs. These can harm your developing baby or cause a miscarriage.  Ask your health care provider or pharmacist before taking any prescription or over-the-counter medicines, herbs, or supplements.  Limit your caffeine intake to no more than 200 mg per day.  Exercise. Unless told otherwise by your health care provider, try to get 30 minutes of moderate exercise most  days of the week. Do not  do high-impact activities, contact sports, or activities with a high risk of falling, such as horseback riding or downhill skiing.  Get plenty of rest.  Avoid anything that raises your body temperature, such as hot tubs and saunas.  If you own a cat, do not empty its litter box. Bacteria contained in cat feces can cause an infection called toxoplasmosis. This can result in serious harm to the fetus.  Stay away from chemicals such as insecticides, lead, mercury, and cleaning or paint products that contain solvents.  Do not have any X-rays taken unless medically necessary.  Take a childbirth and breastfeeding preparation class. Ask your health care provider if you need a referral or recommendation. This information is not intended to replace advice given to you by your health care provider. Make sure you discuss any questions you have with your health care provider. Document Released: 11/26/2003 Document Revised: 04/27/2016 Document Reviewed: 02/07/2014 Elsevier Interactive Patient Education  2017 [employer].

## 2017-02-04 ENCOUNTER — Telehealth: Payer: Self-pay | Admitting: Certified Nurse Midwife

## 2017-02-04 NOTE — Telephone Encounter (Signed)
PT IS PREGNANT AND SHE IS HAVING ISSUES WITH CONSTIPATION AND WOULD LIKE TO KNOW WHAT SHE CAN TAKE AND DO. PT SEES ANNI

## 2017-02-23 ENCOUNTER — Encounter: Payer: Self-pay | Admitting: Certified Nurse Midwife

## 2017-02-23 ENCOUNTER — Ambulatory Visit (INDEPENDENT_AMBULATORY_CARE_PROVIDER_SITE_OTHER): Payer: Medicaid Other

## 2017-02-23 ENCOUNTER — Ambulatory Visit (INDEPENDENT_AMBULATORY_CARE_PROVIDER_SITE_OTHER): Payer: Medicaid Other | Admitting: Certified Nurse Midwife

## 2017-02-23 VITALS — BP 107/70 | HR 93 | Wt 168.1 lb

## 2017-02-23 DIAGNOSIS — Z3482 Encounter for supervision of other normal pregnancy, second trimester: Secondary | ICD-10-CM | POA: Diagnosis not present

## 2017-02-23 LAB — POCT URINALYSIS DIPSTICK
BILIRUBIN UA: NEGATIVE
Blood, UA: NEGATIVE
Glucose, UA: NEGATIVE
KETONES UA: NEGATIVE
LEUKOCYTES UA: NEGATIVE
Nitrite, UA: NEGATIVE
Protein, UA: NEGATIVE
SPEC GRAV UA: 1.015 (ref 1.030–1.035)
Urobilinogen, UA: NEGATIVE (ref ?–2.0)
pH, UA: 7 (ref 5.0–8.0)

## 2017-02-23 MED ORDER — DOXYLAMINE-PYRIDOXINE 10-10 MG PO TBEC: DELAYED_RELEASE_TABLET | ORAL | 1 refills | Status: DC

## 2017-02-23 MED ORDER — PRENATE MINI 18-0.6-0.4-350 MG PO CAPS: 1.0000 | ORAL_CAPSULE | Freq: Every day | ORAL | 6 refills | Status: AC

## 2017-02-23 NOTE — Patient Instructions (Signed)
Trial of Labor After Cesarean Delivery A trial of labor after cesarean delivery (TOLAC) is when a woman tries to give birth vaginally after a previous cesarean delivery. TOLAC may be a safe and appropriate option for you depending on your medical history and other risk factors. When TOLAC is successful and you are able to have a vaginal delivery, this is called a vaginal birth after cesarean delivery (VBAC). Candidates for TOLAC TOLAC is possible for some women who:  Have undergone one or two prior cesarean deliveries in which the incision of the uterus was horizontal (low transverse).  Are carrying twins and have had one prior low transverse incision during a cesarean delivery.  Do not have a vertical (classical) uterine scar.  Have not had a tear in the wall of their uterus (uterine rupture). TOLAC is also supported for women who meet appropriate criteria and:  Are under the age of 40 years.  Are tall and have a body mass index (BMI) of less than 30.  Have an unknown uterine scar.  Give birth in a facility equipped to handle an emergency cesarean delivery. This team should be able to handle possible complications such as a uterine rupture.  Have thorough counseling about the benefits and risks of TOLAC.  Have discussed future pregnancy plans with their health care provider.  Plan to have several more pregnancies. Most successful candidates for TOLAC:  Have had a successful vaginal delivery before or after their cesarean delivery.  Experience labor that begins naturally on or before the due date (40 weeks of gestation).  Do not have a very large (macrosomic) baby.  Had a prior cesarean delivery but are not currently experiencing factors that would prompt a cesarean delivery (such as a breech position).  Had only one prior cesarean delivery.  Had a prior cesarean delivery that was performed early in labor and not after full cervical dilation. TOLAC may be most appropriate for  women who meet the above guidelines and who plan to have more pregnancies. TOLAC is not recommended for home births. Least successful candidates for TOLAC:  Have an induced labor with an unfavorable cervix. An unfavorable cervix is when the cervix is not dilating enough (among other factors).  Have never had a vaginal delivery.  Have had more than two cesarean deliveries.  Have a pregnancy at more than 40 weeks of gestation.  Are pregnant with a baby with a suspected weight greater than 4,000 grams (8 pounds) and who have no prior history of a vaginal delivery.  Have closely spaced pregnancies. Suggested benefits of TOLAC  You may have a faster recovery time.  You may have a shorter stay in the hospital.  You may have less pain and fewer problems than with a cesarean delivery. Women who have a cesarean delivery have a higher chance of needing blood or getting a fever, an infection, or a blood clot in the legs. Suggested risks of TOLAC The highest risk of complications happens to women who attempt a TOLAC and fail. A failed TOLAC results in an unplanned cesarean delivery. Risks related to [facility] or repeat cesarean deliveries include:  Blood loss.  Infection.  Blood clot.  Injury to surrounding tissues or organs.  Having to remove the uterus (hysterectomy).  Potential problems with the placenta (such as placenta previa or placenta accreta) in future pregnancies. Although very rare, the main concerns with TOLAC are:  Rupture of the uterine scar from a past cesarean delivery.  Needing an emergency cesarean delivery.  Having a bad outcome for the baby (perinatal morbidity). Where to find more information:  American Congress of Obstetricians and Gynecologists: www.acog.org  [employer] Nurse-Midwives: www.midwife.org This information is not intended to replace advice given to you by your health care provider. Make sure you discuss any questions you have with your  health care provider. Document Released: 08/11/2011 Document Revised: 10/21/2016 Document Reviewed: 05/15/2013 Elsevier Interactive Patient Education  2017 [employer]. How a Baby Grows During Pregnancy Pregnancy begins when a female's sperm enters a female's egg (fertilization). This happens in one of the tubes (fallopian tubes) that connect the ovaries to the womb (uterus). The fertilized egg is called an embryo until it reaches 10 weeks. From 10 weeks until birth, it is called a fetus. The fertilized egg moves down the fallopian tube to the uterus. Then it implants into the lining of the uterus and begins to grow. The developing fetus receives oxygen and nutrients through the pregnant woman's bloodstream and the tissues that grow (placenta) to support the fetus. The placenta is the life support system for the fetus. It provides nutrition and removes waste. Learning as much as you can about your pregnancy and how your baby is developing can help you enjoy the experience. It can also make you aware of when there might be a problem and when to ask questions. How Geise does a typical pregnancy last? A pregnancy usually lasts 280 days, or about 40 weeks. Pregnancy is divided into three trimesters:  First trimester: 0-13 weeks.  Second trimester: 14-27 weeks.  Third trimester: 28-40 weeks. The day when your baby is considered ready to be born (full term) is your estimated date of delivery. How does my baby develop month by month? First month  The fertilized egg attaches to the inside of the uterus.  Some cells will form the placenta. Others will form the fetus.  The arms, legs, brain, spinal cord, lungs, and heart begin to develop.  At the end of the first month, the heart begins to beat. Second month  The bones, inner ear, eyelids, hands, and feet form.  The genitals develop.  By the end of 8 weeks, all major organs are developing. Third month  All of the internal organs are  forming.  Teeth develop below the gums.  Bones and muscles begin to grow. The spine can flex.  The skin is transparent.  Fingernails and toenails begin to form.  Arms and legs continue to grow longer, and hands and feet develop.  The fetus is about 3 in (7.6 cm) Stuber. Fourth month  The placenta is completely formed.  The external sex organs, neck, outer ear, eyebrows, eyelids, and fingernails are formed.  The fetus can hear, swallow, and move its arms and legs.  The kidneys begin to produce urine.  The skin is covered with a white waxy coating (vernix) and very fine hair (lanugo). Fifth month  The fetus moves around more and can be felt for the first time (quickening).  The fetus starts to sleep and wake up and may begin to suck its finger.  The nails grow to the end of the fingers.  The organ in the digestive system that makes bile (gallbladder) functions and helps to digest the nutrients.  If your baby is a girl, eggs are present in her ovaries. If your baby is a boy, testicles start to move down into his scrotum. Sixth month  The lungs are formed, but the fetus is not yet able to breathe.  The eyes open. The brain continues to develop.  Your baby has fingerprints and toe prints. Your baby's hair grows thicker.  At the end of the second trimester, the fetus is about 9 in (22.9 cm) Sherwin. Seventh month  The fetus kicks and stretches.  The eyes are developed enough to sense changes in light.  The hands can make a grasping motion.  The fetus responds to sound. Eighth month  All organs and body systems are fully developed and functioning.  Bones harden and taste buds develop. The fetus may hiccup.  Certain areas of the brain are still developing. The skull remains soft. Ninth month  The fetus gains about  lb (0.23 kg) each week.  The lungs are fully developed.  Patterns of sleep develop.  The fetus's head typically moves into a head-down position  (vertex) in the uterus to prepare for birth. If the buttocks move into a vertex position instead, the baby is breech.  The fetus weighs 6-9 lbs (2.72-4.08 kg) and is 19-20 in (48.26-50.8 cm) Minkoff. What can I do to have a healthy pregnancy and help my baby develop?  Eating and Drinking  Eat a healthy diet.  Talk with your health care provider to make sure that you are getting the nutrients that you and your baby need.  Visit www.[employer].be to learn about creating a healthy diet.  Gain a healthy amount of weight during pregnancy as advised by your health care provider. This is usually 25-35 pounds. You may need to:  Gain more if you were underweight before getting pregnant or if you are pregnant with more than one baby.  Gain less if you were overweight or obese when you got pregnant. Medicines and Vitamins  Take prenatal vitamins as directed by your health care provider. These include vitamins such as folic acid, iron, calcium, and vitamin D. They are important for healthy development.  Take medicines only as directed by your health care provider. Read labels and ask a pharmacist or your health care provider whether over-the-counter medicines, supplements, and prescription drugs are safe to take during pregnancy. Activities  Be physically active as advised by your health care provider. Ask your health care provider to recommend activities that are safe for you to do, such as walking or swimming.  Do not participate in strenuous or extreme sports. Lifestyle  Do not drink alcohol.  Do not use any tobacco products, including cigarettes, chewing tobacco, or electronic cigarettes. If you need help quitting, ask your health care provider.  Do not use illegal drugs. Safety  Avoid exposure to mercury, lead, or other heavy metals. Ask your health care provider about common sources of these heavy metals.  Avoid listeria infection during pregnancy. Follow these precautions:  Do not  eat soft cheeses or deli meats.  Do not eat hot dogs unless they have been warmed up to the point of steaming, such as in the microwave oven.  Do not drink unpasteurized milk.  Avoid toxoplasmosis infection during pregnancy. Follow these precautions:  Do not change your cat's litter box, if you have a cat. Ask someone else to do this for you.  Wear gardening gloves while working in the yard. General Instructions  Keep all follow-up visits as directed by your health care provider. This is important. This includes prenatal care and screening tests.  Manage any chronic health conditions. Work closely with your health care provider to keep conditions, such as diabetes, under control. How do I know if my baby is developing  well? At each prenatal visit, your health care provider will do several different tests to check on your health and keep track of your baby's development. These include:  Fundal height.  Your health care provider will measure your growing belly from top to bottom using a tape measure.  Your health care provider will also feel your belly to determine your baby's position.  Heartbeat.  An ultrasound in the first trimester can confirm pregnancy and show a heartbeat, depending on how far along you are.  Your health care provider will check your baby's heart rate at every prenatal visit.  As you get closer to your delivery date, you may have regular fetal heart rate monitoring to make sure that your baby is not in distress.  Second trimester ultrasound.  This ultrasound checks your baby's development. It also indicates your baby's gender. What should I do if I have concerns about my baby's development? Always talk with your health care provider about any concerns that you may have. This information is not intended to replace advice given to you by your health care provider. Make sure you discuss any questions you have with your health care provider. Document Released:  05/11/2008 Document Revised: 04/30/2016 Document Reviewed: 05/02/2014 Elsevier Interactive Patient Education  2017 [employer]

## 2017-02-23 NOTE — Progress Notes (Signed)
ROB- Doing well. She continues to have nausea & vomiting stating that it has improved but she has good days and bad. She Denies LOF, VB, and contractions. Anatomy Ultrasound Completed today. A echogenic intracardiac focus in the left ventricle was noted. Reviewed results with pt.  [facility] up on ultrasound. Consulted with Dr. Valentino Saxonherry/ Dr.Evans for U/S results. Agrees with plan of care. Follow up 4 wks.   Natasha BurkeAnnie Eaton Myers, CNM  ULTRASOUND REPORT  Location: [facility] Date of Service: 02/23/17  Indications:Anatomy U/S Findings:  Natasha Myers intrauterine pregnancy is visualized with FHR at 153 BPM. Biometrics give an (U/S) Gestational age of 30 6/7 weeks and an (U/S) EDD of 07/14/17; this correlates with the clinically established EDD of 07/19/17.  Fetal presentation is Vertex.  EFW: 326g (12 oz). Placenta: Posterior, grade 0, 3.2 cm from internal os. AFI: Adequate with MVP of 4.2 cm.  Anatomic survey is complete and normal; Gender - female  . There is a echogenic intracardiac focus in the left ventricle.  Otherwise normal anatomy scan.  Right Ovary measures 2.7 x 1.8 x 2.4 cm. It is normal in appearance. Left Ovary is nit seen. There is evidence of a corpus luteal cyst in the Right ovary. Survey of the adnexa demonstrates no adnexal masses. There is no free peritoneal fluid in the cul de sac.  Impression: 1. 19 6/7 week Viable Singleton Intrauterine pregnancy by U/S. 2. (U/S) EDD is consistent with Clinically established (LMP) EDD of 07/19/17. 3. There is a echogenic intracardiac focus in the left ventricle.   Recommendations: 1.Clinical correlation with the patient's History and Physical Exam.   Natasha MediciMaria E Myers

## 2017-03-04 LAB — HPV, HIGH-RISK: HPV, HIGH-RISK: POSITIVE

## 2017-03-23 ENCOUNTER — Other Ambulatory Visit: Payer: Self-pay | Admitting: Certified Nurse Midwife

## 2017-03-23 ENCOUNTER — Ambulatory Visit (INDEPENDENT_AMBULATORY_CARE_PROVIDER_SITE_OTHER): Payer: Medicaid Other | Admitting: Certified Nurse Midwife

## 2017-03-23 VITALS — BP 110/6 | HR 104 | Wt 169.4 lb

## 2017-03-23 DIAGNOSIS — Z3482 Encounter for supervision of other normal pregnancy, second trimester: Secondary | ICD-10-CM

## 2017-03-23 DIAGNOSIS — Z3492 Encounter for supervision of normal pregnancy, unspecified, second trimester: Secondary | ICD-10-CM

## 2017-03-23 DIAGNOSIS — Z131 Encounter for screening for diabetes mellitus: Secondary | ICD-10-CM

## 2017-03-23 DIAGNOSIS — K117 Disturbances of salivary secretion: Secondary | ICD-10-CM | POA: Insufficient documentation

## 2017-03-23 DIAGNOSIS — Z130 Encounter for screening for diseases of the blood and blood-forming organs and certain disorders involving the immune mechanism: Secondary | ICD-10-CM

## 2017-03-23 LAB — POCT URINALYSIS DIPSTICK
BILIRUBIN UA: NEGATIVE
Blood, UA: NEGATIVE
GLUCOSE UA: NEGATIVE
Ketones, UA: NEGATIVE
LEUKOCYTES UA: NEGATIVE
Nitrite, UA: NEGATIVE
Protein, UA: NEGATIVE
Spec Grav, UA: 1.02 (ref 1.010–1.025)
Urobilinogen, UA: NEGATIVE E.U./dL — AB
pH, UA: 6 (ref 5.0–8.0)

## 2017-03-23 NOTE — Patient Instructions (Signed)

## 2017-03-23 NOTE — Progress Notes (Signed)
Referral to [facility] for abnormal result at 20 wk ultrasound. Echogenic intracardiac focus in the left ventricle.   Doreene Burke, CNM

## 2017-03-23 NOTE — Progress Notes (Signed)
ROB, pt doing well complains of excess sylvia production. Reviewed ptyalism in pregnancy. She denies loss of fluid, vag bleeding and contractions. Fundal height difficult to measure due to significant gas in the abdomen. Pt states that she since she had her gall bladder out she has times when is has increased gas. Maria the ultrasound tech. Looked under ultrasound to confirm that it is gas and the fluid appears normal. Discussed glucola testing that will be completed at the next visit. She will return in 4-5 wks.   Doreene Burke, CNM

## 2017-03-24 ENCOUNTER — Telehealth: Payer: Self-pay | Admitting: *Deleted

## 2017-03-24 DIAGNOSIS — O283 Abnormal ultrasonic finding on antenatal screening of mother: Secondary | ICD-10-CM

## 2017-03-24 NOTE — Telephone Encounter (Signed)
Referral to [facility]  

## 2017-03-25 ENCOUNTER — Other Ambulatory Visit: Payer: Self-pay | Admitting: Certified Nurse Midwife

## 2017-03-25 DIAGNOSIS — Z3689 Encounter for other specified antenatal screening: Secondary | ICD-10-CM

## 2017-04-08 ENCOUNTER — Ambulatory Visit
Admission: RE | Admit: 2017-04-08 | Discharge: 2017-04-08 | Disposition: A | Discharge status: Home / Self Care | Payer: Medicaid Other | Source: Ambulatory Visit | Attending: Maternal & Fetal Medicine | Admitting: Maternal & Fetal Medicine

## 2017-04-08 DIAGNOSIS — Z3689 Encounter for other specified antenatal screening: Secondary | ICD-10-CM

## 2017-04-14 ENCOUNTER — Encounter: Payer: Self-pay | Admitting: *Deleted

## 2017-04-14 ENCOUNTER — Inpatient Hospital Stay
Admission: EM | Admit: 2017-04-14 | Discharge: 2017-04-14 | Disposition: A | Discharge status: Home / Self Care | Payer: Medicaid Other | Attending: Obstetrics and Gynecology | Admitting: Obstetrics and Gynecology

## 2017-04-14 DIAGNOSIS — Z3A26 26 weeks gestation of pregnancy: Secondary | ICD-10-CM | POA: Insufficient documentation

## 2017-04-14 DIAGNOSIS — O26892 Other specified pregnancy related conditions, second trimester: Secondary | ICD-10-CM | POA: Diagnosis not present

## 2017-04-14 DIAGNOSIS — M545 Low back pain: Secondary | ICD-10-CM | POA: Diagnosis present

## 2017-04-14 MED ORDER — ACETAMINOPHEN 500 MG PO TABS
1000.0000 mg | ORAL_TABLET | Freq: Four times a day (QID) | ORAL | Status: DC | PRN
  Administered 2017-04-14: 1000 mg via ORAL

## 2017-04-14 MED ORDER — ACETAMINOPHEN 500 MG PO TABS
ORAL_TABLET | ORAL | Status: AC
  Administered 2017-04-14: 1000 mg via ORAL
  Filled 2017-04-14: qty 2

## 2017-04-14 NOTE — OB Triage Note (Signed)
Recvd pt from ED. Pt c/o pelvic and back pressure. Feels like she can't bend over or stand up straight. Feeling baby move ok. No intercourse in the past 24 hours. No vaginal bleeding. Took tylenol earlier today but did not help much. Sat down most of the day at work.

## 2017-04-14 NOTE — Discharge Instructions (Signed)
Call office if pelvic/back pressure get worse by Friday. May try ice and heat applied to lower back and pelvic area May take tylenol as needed according to medication dosage times   Come back if: Big gush of fluid Heavy vaginal bleeding Temp over 100.4 Decreased fetal movement Contractions every 3-5 min lasting at least one hour  Get plenty of rest and stay well hydrated!

## 2017-04-14 NOTE — OB Triage Provider Note (Signed)
L&D OB Triage Note  Natasha Myers is a 31 y.o. [id number] female at 8073w2d, EDD Estimated Date of Delivery: 07/19/17 who presented to triage for complaints of low back pain and stiffness x 2 days, a little worse today.  She was evaluated by the nurses with no significant findings/findings significant for labor. Vital signs stable. An NST was performed and has been reviewed by me. She was treated with po tylenol.   NST INTERPRETATION: Indications: rule out uterine contractions                   Impression: reactive with baseline 140s, positive accelerations, no decels, no uterine contractions.   Plan: NST performed was reviewed and was found to be reactive. She was discharged home with bleeding/labor precautions.  Continue routine prenatal care. Follow up with OB/GYN as previously scheduled.     Melody Suzan NailerN Shambley, CNM

## 2017-04-22 ENCOUNTER — Ambulatory Visit (INDEPENDENT_AMBULATORY_CARE_PROVIDER_SITE_OTHER): Payer: Medicaid Other | Admitting: Certified Nurse Midwife

## 2017-04-22 ENCOUNTER — Encounter: Payer: Self-pay | Admitting: Certified Nurse Midwife

## 2017-04-22 ENCOUNTER — Other Ambulatory Visit: Payer: Medicaid Other

## 2017-04-22 VITALS — BP 113/63 | HR 85 | Wt 171.6 lb

## 2017-04-22 DIAGNOSIS — Z23 Encounter for immunization: Secondary | ICD-10-CM

## 2017-04-22 DIAGNOSIS — Z131 Encounter for screening for diabetes mellitus: Secondary | ICD-10-CM

## 2017-04-22 DIAGNOSIS — Z3482 Encounter for supervision of other normal pregnancy, second trimester: Secondary | ICD-10-CM | POA: Diagnosis not present

## 2017-04-22 DIAGNOSIS — Z130 Encounter for screening for diseases of the blood and blood-forming organs and certain disorders involving the immune mechanism: Secondary | ICD-10-CM

## 2017-04-22 LAB — POCT URINALYSIS DIPSTICK
Bilirubin, UA: NEGATIVE
Blood, UA: NEGATIVE
GLUCOSE UA: NEGATIVE
KETONES UA: NEGATIVE
Leukocytes, UA: NEGATIVE
Nitrite, UA: NEGATIVE
PROTEIN UA: NEGATIVE
SPEC GRAV UA: 1.01 (ref 1.010–1.025)
Urobilinogen, UA: 0.2 E.U./dL
pH, UA: 6.5 (ref 5.0–8.0)

## 2017-04-22 NOTE — Patient Instructions (Addendum)
Tdap Vaccine (Tetanus, Diphtheria and Pertussis): What You Need to Know 1. Why get vaccinated? Tetanus, diphtheria and pertussis are very serious diseases. Tdap vaccine can protect us from these diseases. And, Tdap vaccine given to pregnant women can protect newborn babies against pertussis. TETANUS (Lockjaw) is rare in the [place] today. It causes painful muscle tightening and stiffness, usually all over the body.  It can lead to tightening of muscles in the head and neck so you can't open your mouth, swallow, or sometimes even breathe. Tetanus kills about 1 out of 10 people who are infected even after receiving the best medical care. DIPHTHERIA is also rare in the [place] today. It can cause a thick coating to form in the back of the throat.  It can lead to breathing problems, heart failure, paralysis, and death. PERTUSSIS (Whooping Cough) causes severe coughing spells, which can cause difficulty breathing, vomiting and disturbed sleep.  It can also lead to weight loss, incontinence, and rib fractures. Up to 2 in 100 adolescents and 5 in 100 adults with pertussis are hospitalized or have complications, which could include pneumonia or death. These diseases are caused by bacteria. Diphtheria and pertussis are spread from person to person through secretions from coughing or sneezing. Tetanus enters the body through cuts, scratches, or wounds. Before vaccines, as many as 200,000 cases of diphtheria, 200,000 cases of pertussis, and hundreds of cases of tetanus, were reported in the [place] each year. Since vaccination began, reports of cases for tetanus and diphtheria have dropped by about 99% and for pertussis by about 80%. 2. Tdap vaccine Tdap vaccine can protect adolescents and adults from tetanus, diphtheria, and pertussis. One dose of Tdap is routinely given at age 11 or 12. People who did not get Tdap at that age should get it as soon as possible. Tdap is especially important  for healthcare professionals and anyone having close contact with a baby younger than 12 months. Pregnant women should get a dose of Tdap during every pregnancy, to protect the newborn from pertussis. Infants are most at risk for severe, life-threatening complications from pertussis. Another vaccine, called Td, protects against tetanus and diphtheria, but not pertussis. A Td booster should be given every 10 years. Tdap may be given as one of these boosters if you have never gotten Tdap before. Tdap may also be given after a severe cut or burn to prevent tetanus infection. Your doctor or the person giving you the vaccine can give you more information. Tdap may safely be given at the same time as other vaccines. 3. Some people should not get this vaccine  A person who has ever had a life-threatening allergic reaction after a previous dose of any diphtheria, tetanus or pertussis containing vaccine, OR has a severe allergy to any part of this vaccine, should not get Tdap vaccine. Tell the person giving the vaccine about any severe allergies.  Anyone who had coma or Goda repeated seizures within 7 days after a childhood dose of DTP or DTaP, or a previous dose of Tdap, should not get Tdap, unless a cause other than the vaccine was found. They can still get Td.  Talk to your doctor if you:  have seizures or another nervous system problem,  had severe pain or swelling after any vaccine containing diphtheria, tetanus or pertussis,  ever had a condition called Guillain-Barr Syndrome (GBS),  aren't feeling well on the day the shot is scheduled. 4. Risks With any medicine, including vaccines, there is   a chance of side effects. These are usually mild and go away on their own. Serious reactions are also possible but are rare. Most people who get Tdap vaccine do not have any problems with it. Mild problems following Tdap:  (Did not interfere with activities)  Pain where the shot was given (about 3 in 4  adolescents or 2 in 3 adults)  Redness or swelling where the shot was given (about 1 person in 5)  Mild fever of at least 100.24F (up to about 1 in 25 adolescents or 1 in 100 adults)  Headache (about 3 or 4 people in 10)  Tiredness (about 1 person in 3 or 4)  Nausea, vomiting, diarrhea, stomach ache (up to 1 in 4 adolescents or 1 in 10 adults)  Chills, sore joints (about 1 person in 10)  Body aches (about 1 person in 3 or 4)  Rash, swollen glands (uncommon) Moderate problems following Tdap:  (Interfered with activities, but did not require medical attention)  Pain where the shot was given (up to 1 in 5 or 6)  Redness or swelling where the shot was given (up to about 1 in 16 adolescents or 1 in 12 adults)  Fever over 102F (about 1 in 100 adolescents or 1 in 250 adults)  Headache (about 1 in 7 adolescents or 1 in 10 adults)  Nausea, vomiting, diarrhea, stomach ache (up to 1 or 3 people in 100)  Swelling of the entire arm where the shot was given (up to about 1 in 500). Severe problems following Tdap:  (Unable to perform usual activities; required medical attention)  Swelling, severe pain, bleeding and redness in the arm where the shot was given (rare). Problems that could happen after any vaccine:   People sometimes faint after a medical procedure, including vaccination. Sitting or lying down for about 15 minutes can help prevent fainting, and injuries caused by a fall. Tell your doctor if you feel dizzy, or have vision changes or ringing in the ears.  Some people get severe pain in the shoulder and have difficulty moving the arm where a shot was given. This happens very rarely.  Any medication can cause a severe allergic reaction. Such reactions from a vaccine are very rare, estimated at fewer than 1 in a million doses, and would happen within a few minutes to a few hours after the vaccination. As with any medicine, there is a very remote chance of a vaccine causing a  serious injury or death. The safety of vaccines is always being monitored. For more information, visit: http://www.aguilar.org/ 5. What if there is a serious problem? What should I look for?  Look for anything that concerns you, such as signs of a severe allergic reaction, very high fever, or unusual behavior. Signs of a severe allergic reaction can include hives, swelling of the face and throat, difficulty breathing, a fast heartbeat, dizziness, and weakness. These would usually start a few minutes to a few hours after the vaccination. What should I do?   If you think it is a severe allergic reaction or other emergency that can't wait, call 9-1-1 or get the person to the nearest hospital. Otherwise, call your doctor.  Afterward, the reaction should be reported to the Vaccine Adverse Event Reporting System (VAERS). Your doctor might file this report, or you can do it yourself through the VAERS web site at www.vaers.SamedayNews.es, or by calling 2392699893.  VAERS does not give medical advice. 6. The National Vacc[employer] Injury [employer]  Vaccine Injury Compensation Program (VICP) is a federal program that was created to compensate people who may have been injured by certain vaccines. Persons who believe they may have been injured by a vaccine can learn about the program and about filing a claim by calling 1-435-168-9307 or visiting the VICP website at SpiritualWord.at. There is a time limit to file a claim for compensation. 7. How can I learn more?  Ask your doctor. He or she can give you the vaccine package insert or suggest other sources of information.  Call your local or state health department.  Contact the Centers for Disease Control and Prevention (CDC):  Call 5633633741 (1-800-CDC-INFO) or  Visit CDC's website at PicCapture.uy CDC Tdap Vaccine VIS (01/30/14) This information is not intended to replace advice given to you by your health  care provider. Make sure you discuss any questions you have with your health care provider. Document Released: 05/24/2012 Document Revised: 08/13/2016 Document Reviewed: 08/13/2016 Elsevier Interactive Patient Education  2017 [employer]. Trial of Labor After Cesarean Delivery A trial of labor after cesarean delivery (TOLAC) is when a woman tries to give birth vaginally after a previous cesarean delivery. TOLAC may be a safe and appropriate option for you depending on your medical history and other risk factors. When TOLAC is successful and you are able to have a vaginal delivery, this is called a vaginal birth after cesarean delivery (VBAC). Candidates for TOLAC TOLAC is possible for some women who:  Have undergone one or two prior cesarean deliveries in which the incision of the uterus was horizontal (low transverse).  Are carrying twins and have had one prior low transverse incision during a cesarean delivery.  Do not have a vertical (classical) uterine scar.  Have not had a tear in the wall of their uterus (uterine rupture). TOLAC is also supported for women who meet appropriate criteria and:  Are under the age of 40 years.  Are tall and have a body mass index (BMI) of less than 30.  Have an unknown uterine scar.  Give birth in a facility equipped to handle an emergency cesarean delivery. This team should be able to handle possible complications such as a uterine rupture.  Have thorough counseling about the benefits and risks of TOLAC.  Have discussed future pregnancy plans with their health care provider.  Plan to have several more pregnancies. Most successful candidates for TOLAC:  Have had a successful vaginal delivery before or after their cesarean delivery.  Experience labor that begins naturally on or before the due date (40 weeks of gestation).  Do not have a very large (macrosomic) baby.  Had a prior cesarean delivery but are not currently experiencing factors that  would prompt a cesarean delivery (such as a breech position).  Had only one prior cesarean delivery.  Had a prior cesarean delivery that was performed early in labor and not after full cervical dilation. TOLAC may be most appropriate for women who meet the above guidelines and who plan to have more pregnancies. TOLAC is not recommended for home births. Least successful candidates for TOLAC:  Have an induced labor with an unfavorable cervix. An unfavorable cervix is when the cervix is not dilating enough (among other factors).  Have never had a vaginal delivery.  Have had more than two cesarean deliveries.  Have a pregnancy at more than 40 weeks of gestation.  Are pregnant with a baby with a suspected weight greater than 4,000 grams (8 pounds) and who have no prior history  of a vaginal delivery.  Have closely spaced pregnancies. Suggested benefits of TOLAC  You may have a faster recovery time.  You may have a shorter stay in the hospital.  You may have less pain and fewer problems than with a cesarean delivery. Women who have a cesarean delivery have a higher chance of needing blood or getting a fever, an infection, or a blood clot in the legs. Suggested risks of TOLAC The highest risk of complications happens to women who attempt a TOLAC and fail. A failed TOLAC results in an unplanned cesarean delivery. Risks related to [facility] or repeat cesarean deliveries include:  Blood loss.  Infection.  Blood clot.  Injury to surrounding tissues or organs.  Having to remove the uterus (hysterectomy).  Potential problems with the placenta (such as placenta previa or placenta accreta) in future pregnancies. Although very rare, the main concerns with TOLAC are:  Rupture of the uterine scar from a past cesarean delivery.  Needing an emergency cesarean delivery.  Having a bad outcome for the baby (perinatal morbidity). Where to find more information:  American Congress of  Obstetricians and Gynecologists: www.acog.org  [employer]: www.midwife.org This information is not intended to replace advice given to you by your health care provider. Make sure you discuss any questions you have with your health care provider. Document Released: 08/11/2011 Document Revised: 10/21/2016 Document Reviewed: 05/15/2013 Elsevier Interactive Patient Education  2017 [employer]  Third Trimester of Pregnancy The third trimester is from week 29 through week 42, months 7 through 9. This trimester is when your unborn baby (fetus) is growing very fast. At the end of the ninth month, the unborn baby is about 20 inches in length. It weighs about 6-10 pounds. Follow these instructions at home:  Avoid all smoking, herbs, and alcohol. Avoid drugs not approved by your doctor.  Do not use any tobacco products, including cigarettes, chewing tobacco, and electronic cigarettes. If you need help quitting, ask your doctor. You may get counseling or other support to help you quit.  Only take medicine as told by your doctor. Some medicines are safe and some are not during pregnancy.  Exercise only as told by your doctor. Stop exercising if you start having cramps.  Eat regular, healthy meals.  Wear a good support bra if your breasts are tender.  Do not use hot tubs, steam rooms, or saunas.  Wear your seat belt when driving.  Avoid raw meat, uncooked cheese, and liter boxes and soil used by cats.  Take your prenatal vitamins.  Take 1500-2000 milligrams of calcium daily starting at the 20th week of pregnancy until you deliver your baby.  Try taking medicine that helps you poop (stool softener) as needed, and if your doctor approves. Eat more fiber by eating fresh fruit, vegetables, and whole grains. Drink enough fluids to keep your pee (urine) clear or pale yellow.  Take warm water baths (sitz baths) to soothe pain or discomfort caused by hemorrhoids. Use  hemorrhoid cream if your doctor approves.  If you have puffy, bulging veins (varicose veins), wear support hose. Raise (elevate) your feet for 15 minutes, 3-4 times a day. Limit salt in your diet.  Avoid heavy lifting, wear low heels, and sit up straight.  Rest with your legs raised if you have leg cramps or low back pain.  Visit your dentist if you have not gone during your pregnancy. Use a soft toothbrush to brush your teeth. Be gentle when you floss.  You  can have sex (intercourse) unless your doctor tells you not to.  Do not travel far distances unless you must. Only do so with your doctor's approval.  Take prenatal classes.  Practice driving to the hospital.  Pack your hospital bag.  Prepare the baby's room.  Go to your doctor visits. Get help if:  You are not sure if you are in labor or if your water has broken.  You are dizzy.  You have mild cramps or pressure in your lower belly (abdominal).  You have a nagging pain in your belly area.  You continue to feel sick to your stomach (nauseous), throw up (vomit), or have watery poop (diarrhea).  You have bad smelling fluid coming from your vagina.  You have pain with peeing (urination). Get help right away if:  You have a fever.  You are leaking fluid from your vagina.  You are spotting or bleeding from your vagina.  You have severe belly cramping or pain.  You lose or gain weight rapidly.  You have trouble catching your breath and have chest pain.  You notice sudden or extreme puffiness (swelling) of your face, hands, ankles, feet, or legs.  You have not felt the baby move in over an hour.  You have severe headaches that do not go away with medicine.  You have vision changes. This information is not intended to replace advice given to you by your health care provider. Make sure you discuss any questions you have with your health care provider. Document Released: 02/17/2010 Document Revised: 04/30/2016  Document Reviewed: 01/24/2013 Elsevier Interactive Patient Education  2017 [employer] Inc. Round Ligament Pain The round ligament is a cord of muscle and tissue that helps to support the uterus. It can become a source of pain during pregnancy if it becomes stretched or twisted as the baby grows. The pain usually begins in the second trimester of pregnancy, and it can come and go until the baby is delivered. It is not a serious problem, and it does not cause harm to the baby. Round ligament pain is usually a short, sharp, and pinching pain, but it can also be a dull, lingering, and aching pain. The pain is felt in the lower side of the abdomen or in the groin. It usually starts deep in the groin and moves up to the outside of the hip area. Pain can occur with:  A sudden change in position.  Rolling over in bed.  Coughing or sneezing.  Physical activity. Follow these instructions at home: Watch your condition for any changes. Take these steps to help with your pain:  When the pain starts, relax. Then try:  Sitting down.  Flexing your knees up to your abdomen.  Lying on your side with one pillow under your abdomen and another pillow between your legs.  Sitting in a warm bath for 15-20 minutes or until the pain goes away.  Take over-the-counter and prescription medicines only as told by your health care provider.  Move slowly when you sit and stand.  Avoid Burkel walks if they cause pain.  Stop or lessen your physical activities if they cause pain. Contact a health care provider if:  Your pain does not go away with treatment.  You feel pain in your back that you did not have before.  Your medicine is not helping. Get help right away if:  You develop a fever or chills.  You develop uterine contractions.  You develop vaginal bleeding.  You develop nausea or  vomiting.  You develop diarrhea.  You have pain when you urinate. This information is not intended to replace advice  given to you by your health care provider. Make sure you discuss any questions you have with your health care provider. Document Released: 09/01/2008 Document Revised: 04/30/2016 Document Reviewed: 01/30/2015 Elsevier Interactive Patient Education  2017 [employer].

## 2017-04-23 ENCOUNTER — Encounter: Payer: Self-pay | Admitting: Certified Nurse Midwife

## 2017-04-23 LAB — GLUCOSE, 1 HOUR GESTATIONAL: Gestational Diabetes Screen: 105 mg/dL (ref 65–139)

## 2017-04-23 LAB — HEMOGLOBIN AND HEMATOCRIT, BLOOD
Hematocrit: 31.8 % — ABNORMAL LOW (ref 34.0–46.6)
Hemoglobin: 10.8 g/dL — ABNORMAL LOW (ref 11.1–15.9)

## 2017-04-23 NOTE — Progress Notes (Signed)
ROB-Pt doing well. Glucola and H/H today. BTC signed. Declined TDaP, previously [facility].Encouraged all family members not immunized in the last 10 years to receive a TDaP as well. Schedule of CBC given. Discussed home treatment measures for round ligament pain and lower extremity swelling including abdominal support, compression stockings, and adequate hydration. Plans breastfeeding, skin to skin, and delayed cord clamping. Declines PP contraception at this time. Reviewed red flag symptoms and when to call. RTC x 2 weeks for ROB or sooner if needed.

## 2017-05-06 ENCOUNTER — Encounter: Payer: Self-pay | Admitting: Certified Nurse Midwife

## 2017-05-06 ENCOUNTER — Ambulatory Visit (INDEPENDENT_AMBULATORY_CARE_PROVIDER_SITE_OTHER): Payer: Medicaid Other | Admitting: Certified Nurse Midwife

## 2017-05-06 VITALS — BP 99/59 | HR 85 | Wt 169.3 lb

## 2017-05-06 DIAGNOSIS — Z98891 History of uterine scar from previous surgery: Secondary | ICD-10-CM | POA: Insufficient documentation

## 2017-05-06 DIAGNOSIS — Z3482 Encounter for supervision of other normal pregnancy, second trimester: Secondary | ICD-10-CM | POA: Diagnosis not present

## 2017-05-06 DIAGNOSIS — O219 Vomiting of pregnancy, unspecified: Secondary | ICD-10-CM

## 2017-05-06 LAB — POCT URINALYSIS DIPSTICK
Bilirubin, UA: NEGATIVE
Blood, UA: NEGATIVE
GLUCOSE UA: NEGATIVE
LEUKOCYTES UA: NEGATIVE
Nitrite, UA: NEGATIVE
Protein, UA: NEGATIVE
SPEC GRAV UA: 1.025 (ref 1.010–1.025)
Urobilinogen, UA: 0.2 E.U./dL
pH, UA: 6.5 (ref 5.0–8.0)

## 2017-05-06 MED ORDER — ONDANSETRON 4 MG PO TBDP: 4.0000 mg | ORAL_TABLET | Freq: Four times a day (QID) | ORAL | 0 refills | Status: DC | PRN

## 2017-05-06 NOTE — Progress Notes (Signed)
ROB-Pt reports increased reflux pain and nausea, relieved slightly by Zantac, Tums, and Diclegis. Denies blurred vision, headache, or lower extremity pain or swelling. Discussed reflux management techniques including positioning after eating and eating small frequent protein packed meals. Rx Zofran, see orders. Encouraged oral rehydration drinks as needed. Reviewed red flag symptoms and when to call. RTC x 2 weeks for ROB with Dr Valentino Saxonherry to discuss TOLAC, then [facility]

## 2017-05-06 NOTE — Patient Instructions (Addendum)
Trial of Labor After Cesarean Delivery A trial of labor after cesarean delivery (TOLAC) is when a woman tries to give birth vaginally after a previous cesarean delivery. TOLAC may be a safe and appropriate option for you depending on your medical history and other risk factors. When TOLAC is successful and you are able to have a vaginal delivery, this is called a vaginal birth after cesarean delivery (VBAC). Candidates for TOLAC TOLAC is possible for some women who:  Have undergone one or two prior cesarean deliveries in which the incision of the uterus was horizontal (low transverse).  Are carrying twins and have had one prior low transverse incision during a cesarean delivery.  Do not have a vertical (classical) uterine scar.  Have not had a tear in the wall of their uterus (uterine rupture).  TOLAC is also supported for women who meet appropriate criteria and:  Are under the age of 40 years.  Are tall and have a body mass index (BMI) of less than 30.  Have an unknown uterine scar.  Give birth in a facility equipped to handle an emergency cesarean delivery. This team should be able to handle possible complications such as a uterine rupture.  Have thorough counseling about the benefits and risks of TOLAC.  Have discussed future pregnancy plans with their health care provider.  Plan to have several more pregnancies.  Most successful candidates for TOLAC:  Have had a successful vaginal delivery before or after their cesarean delivery.  Experience labor that begins naturally on or before the due date (40 weeks of gestation).  Do not have a very large (macrosomic) baby.  Had a prior cesarean delivery but are not currently experiencing factors that would prompt a cesarean delivery (such as a breech position).  Had only one prior cesarean delivery.  Had a prior cesarean delivery that was performed early in labor and not after full cervical dilation. TOLAC may be most appropriate  for women who meet the above guidelines and who plan to have more pregnancies. TOLAC is not recommended for home births. Least successful candidates for TOLAC:  Have an induced labor with an unfavorable cervix. An unfavorable cervix is when the cervix is not dilating enough (among other factors).  Have never had a vaginal delivery.  Have had more than two cesarean deliveries.  Have a pregnancy at more than 40 weeks of gestation.  Are pregnant with a baby with a suspected weight greater than 4,000 grams (8 pounds) and who have no prior history of a vaginal delivery.  Have closely spaced pregnancies. Suggested benefits of TOLAC  You may have a faster recovery time.  You may have a shorter stay in the hospital.  You may have less pain and fewer problems than with a cesarean delivery. Women who have a cesarean delivery have a higher chance of needing blood or getting a fever, an infection, or a blood clot in the legs. Suggested risks of TOLAC The highest risk of complications happens to women who attempt a TOLAC and fail. A failed TOLAC results in an unplanned cesarean delivery. Risks related to TOLAC or repeat cesarean deliveries include:  Blood loss.  Infection.  Blood clot.  Injury to surrounding tissues or organs.  Having to remove the uterus (hysterectomy).  Potential problems with the placenta (such as placenta previa or placenta accreta) in future pregnancies.  Although very rare, the main concerns with TOLAC are:  Rupture of the uterine scar from a past cesarean delivery.  Needing an emergency   cesarean delivery.  Having a bad outcome for the baby (perinatal morbidity).  Where to find more information:  American Congress of Obstetricians and Gynecologists: www.acog.org  [employer] Nurse-Mid[employer]: www.midwife.org This information is not intended to replace advice given to you by your health care provider. Make sure you discuss any questions you have with  your health care provider. Document Released: 08/11/2011 Document Revised: 10/21/2016 Document Reviewed: 05/15/2013 Elsevier Interactive Patient Education  2018 Else[employer] Inc. Ondansetron oral dissolving tablet What is this medicine? ONDANSETRON (on DAN se tron) is used to treat nausea and vomiting caused by chemotherapy. It is also used to prevent or treat nausea and vomiting after surgery. This medicine may be used for other purposes; ask your health care provider or pharmacist if you have questions. COMMON BRAND NAME(S): Zofran ODT What should I tell my health care provider before I take this medicine? They need to know if you have any of these conditions: -heart disease -history of irregular heartbeat -liver disease -low levels of magnesium or potassium in the blood -an unusual or allergic reaction to ondansetron, granisetron, other medicines, foods, dyes, or preservatives -pregnant or trying to get pregnant -breast-feeding How should I use this medicine? These tablets are made to dissolve in the mouth. Do not try to push the tablet through the foil backing. With dry hands, peel away the foil backing and gently remove the tablet. Place the tablet in the mouth and allow it to dissolve, then swallow. While you may take these tablets with water, it is not necessary to do so. Talk to your pediatrician regarding the use of this medicine in children. Special care may be needed. Overdosage: If you think you have taken too much of this medicine contact a poison control center or emergency room at once. NOTE: This medicine is only for you. Do not share this medicine with others. What if I miss a dose? If you miss a dose, take it as soon as you can. If it is almost time for your next dose, take only that dose. Do not take double or extra doses. What may interact with this medicine? Do not take this medicine with any of the following medications: -apomorphine -certain medicines for fungal  infections like fluconazole, itraconazole, ketoconazole, posaconazole, voriconazole -cisapride -dofetilide -dronedarone -pimozide -thioridazine -ziprasidone This medicine may also interact with the following medications: -carbamazepine -certain medicines for depression, anxiety, or psychotic disturbances -fentanyl -linezolid -MAOIs like Carbex, Eldepryl, Marplan, Nardil, and Parnate -methylene blue (injected into a vein) -other medicines that prolong the QT interval (cause an abnormal heart rhythm) -phenytoin -rifampicin -tramadol This list may not describe all possible interactions. Give your health care provider a list of all the medicines, herbs, non-prescription drugs, or dietary supplements you use. Also tell them if you smoke, drink alcohol, or use illegal drugs. Some items may interact with your medicine. What should I watch for while using this medicine? Check with your doctor or health care professional as soon as you can if you have any sign of an allergic reaction. What side effects may I notice from receiving this medicine? Side effects that you should report to your doctor or health care professional as soon as possible: -allergic reactions like skin rash, itching or hives, swelling of the face, lips, or tongue -breathing problems -confusion -dizziness -fast or irregular heartbeat -feeling faint or lightheaded, falls -fever and chills -loss of balance or coordination -seizures -sweating -swelling of the hands and feet -tightness in the chest -tremors -unusually weak or  tired Side effects that usually do not require medical attention (report to your doctor or health care professional if they continue or are bothersome): -constipation or diarrhea -headache This list may not describe all possible side effects. Call your doctor for medical advice about side effects. You may report side effects to FDA at 1-800-FDA-1088. Where should I keep my medicine? Keep out of  the reach of children. Store between 2 and 30 degrees C (36 and 86 degrees F). Throw away any unused medicine after the expiration date. NOTE: This sheet is a summary. It may not cover all possible information. If you have questions about this medicine, talk to your doctor, pharmacist, or health care provider.  2018 Elsevier/Gold Standard (2013-08-30 16:21:52)  Heartburn During Pregnancy Heartburn is a type of pain or discomfort that can happen in the throat or chest. It is often described as a burning sensation. Heartburn is common during pregnancy because:  A hormone (progesterone) that is released during pregnancy may relax the valve (lower esophageal sphincter, or LES) that separates the esophagus from the stomach. This allows stomach acid to move up into the esophagus, causing heartburn.  The uterus gets larger and pushes up on the stomach, which pushes more acid into the esophagus. This is especially true in the later stages of pregnancy.  Heartburn usually goes away or gets better after giving birth. What are the causes? Heartburn is caused by stomach acid backing up into the esophagus (reflux). Reflux can be triggered by:  Changing hormone levels.  Large meals.  Certain foods and beverages, such as coffee, chocolate, onions, and peppermint.  Exercise.  Increased stomach acid production.  What increases the risk? You are more likely to experience heartburn during pregnancy if you:  Had heartburn prior to becoming pregnant.  Have been pregnant more than once before.  Are overweight or obese.  The likelihood that you will get heartburn also increases as you get farther along in your pregnancy, especially during the last trimester. What are the signs or symptoms? Symptoms of this condition include:  Burning pain in the chest or lower throat.  Bitter taste in the mouth.  Coughing.  Problems swallowing.  Vomiting.  Hoarse voice.  Asthma.  Symptoms may get worse  when you lie down or bend over. Symptoms are often worse at night. How is this diagnosed? This condition is diagnosed based on:  Your medical history.  Your symptoms.  Blood tests to check for a certain type of bacteria associated with heartburn.  Whether taking heartburn medicine relieves your symptoms.  Examination of the stomach and esophagus using a tube with a light and camera on the end (endoscopy).  How is this treated? Treatment varies depending on how severe your symptoms are. Your health care provider may recommend:  Over-the-counter medicines (antacids or acid reducers) for mild heartburn.  Prescription medicines to decrease stomach acid or to protect your stomach lining.  Certain changes in your diet.  Raising the head of your bed so it is higher than the foot of the bed. This helps prevent stomach acid from backing up into the esophagus when you are lying down.  Follow these instructions at home: Eating and drinking  Do not drink alcohol during your pregnancy.  Identify foods and beverages that make your symptoms worse, and avoid them.  Beverages that you may want to avoid include: ? Coffee and tea (with or without caffeine). ? Energy drinks and sports drinks. ? Carbonated drinks or sodas. ? Citrus fruit juices.  Foods that you may want to avoid include: ? Chocolate and cocoa. ? Peppermint and mint flavorings. ? Garlic, onions, and horseradish. ? Spicy and acidic foods, including peppers, chili powder, curry powder, vinegar, hot sauces, and barbecue sauce. ? Citrus fruits, such as oranges, lemons, and limes. ? Tomato-based foods, such as red sauce, chili, and salsa. ? Fried and fatty foods, such as donuts, french fries, potato chips, and high-fat dressings. ? High-fat meats, such as hot dogs, cold cuts, sausage, ham, and bacon. ? High-fat dairy items, such as whole milk, butter, and cheese.  Eat small, frequent meals instead of large meals.  Avoid  drinking large amounts of liquid with your meals.  Avoid eating meals during the 2-3 hours before bedtime.  Avoid lying down right after you eat.  Do not exercise right after you eat. Medicines  Take over-the-counter and prescription medicines only as told by your health care provider.  Do not take aspirin, ibuprofen, or other NSAIDs unless your health care provider tells you to do that.  You may be instructed to avoid medicines that contain sodium bicarbonate. General instructions  If directed, raise the head of your bed about 6 inches (15 cm) by putting blocks under the legs. Sleeping with more pillows does not effectively relieve heartburn because it only changes the position of your head.  Do not use any products that contain nicotine or tobacco, such as cigarettes and e-cigarettes. If you need help quitting, ask your health care provider.  Wear loose-fitting clothing.  Try to reduce your stress, such as with yoga or meditation. If you need help managing stress, ask your health care provider.  Maintain a healthy weight. If you are overweight, work with your health care provider to safely lose weight.  Keep all follow-up visits as told by your health care provider. This is important. Contact a health care provider if:  You develop new symptoms.  Your symptoms do not improve with treatment.  You have unexplained weight loss.  You have difficulty swallowing.  You make loud sounds when you breathe (wheeze).  You have a cough that does not go away.  You have frequent heartburn for more than 2 weeks.  You have nausea or vomiting that does not get better with treatment.  You have pain in your abdomen. Get help right away if:  You have severe chest pain that spreads to your arm, neck, or jaw.  You feel sweaty, dizzy, or light-headed.  You have shortness of breath.  You have pain when swallowing.  You vomit, and your vomit looks like blood or coffee grounds.  Your  stool is bloody or black. This information is not intended to replace advice given to you by your health care provider. Make sure you discuss any questions you have with your health care provider. Document Released: 11/20/2000 Document Revised: 08/10/2016 Document Reviewed: 08/10/2016 Elsevier Interactive Patient Education  2018 [employer].

## 2017-05-19 ENCOUNTER — Encounter: Payer: Self-pay | Admitting: Obstetrics and Gynecology

## 2017-05-19 ENCOUNTER — Ambulatory Visit (INDEPENDENT_AMBULATORY_CARE_PROVIDER_SITE_OTHER): Payer: Medicaid Other | Admitting: Obstetrics and Gynecology

## 2017-05-19 VITALS — BP 99/63 | HR 92 | Wt 173.1 lb

## 2017-05-19 DIAGNOSIS — O34219 Maternal care for unspecified type scar from previous cesarean delivery: Secondary | ICD-10-CM

## 2017-05-19 DIAGNOSIS — Z3483 Encounter for supervision of other normal pregnancy, third trimester: Secondary | ICD-10-CM

## 2017-05-19 DIAGNOSIS — O99619 Diseases of the digestive system complicating pregnancy, unspecified trimester: Secondary | ICD-10-CM

## 2017-05-19 DIAGNOSIS — K219 Gastro-esophageal reflux disease without esophagitis: Secondary | ICD-10-CM

## 2017-05-19 LAB — POCT URINALYSIS DIPSTICK
Bilirubin, UA: NEGATIVE
Blood, UA: NEGATIVE
GLUCOSE UA: NEGATIVE
KETONES UA: 80
Leukocytes, UA: NEGATIVE
Nitrite, UA: NEGATIVE
Protein, UA: NEGATIVE
Spec Grav, UA: 1.01 (ref 1.010–1.025)
Urobilinogen, UA: 0.2 E.U./dL
pH, UA: 7 (ref 5.0–8.0)

## 2017-05-19 MED ORDER — PANTOPRAZOLE SODIUM 20 MG PO TBEC: 20.0000 mg | DELAYED_RELEASE_TABLET | Freq: Two times a day (BID) | ORAL | 4 refills | Status: DC

## 2017-05-20 NOTE — Progress Notes (Signed)
ROB: Doing well, but complains of worsening heartburn despite use of Tums and Zantac. Will prescribe Protonix. Presents from midwifery service to discuss TOLAC vs repeat C-section.  Patient currently desires TOLAC.  H/o C-section x 1, with indication being failure of descent in 2nd stage (failed vacuum attempt) of 9# 6 oz baby.    The following risks were discussed with the patient.  Risk of uterine rupture at term is 0.78 percent with TOLAC and 0.22 percent with ERCD. 1 in 10 uterine ruptures will result in neonatal death or neurological injury. The benefits of a trial of labor after cesarean (TOLAC) resulting in a vaginal birth after cesarean (VBAC) include the following: shorter length of hospital stay and postpartum recovery (in most cases); fewer complications, such as postpartum fever, wound or uterine infection, thromboembolism (blood clots in the leg or lung), need for blood transfusion and fewer neonatal breathing problems. The risks of an attempted VBAC or TOLAC include the following: Risk of failed trial of labor after cesarean (TOLAC) without a vaginal birth after cesarean (VBAC) resulting in repeat cesarean delivery (RCD) in about 20 to 40 percent of women who attempt VBAC.  Risk of rupture of uterus resulting in an emergency cesarean delivery. The risk of uterine rupture may be related in part to the type of uterine incision made during the first cesarean delivery. A previous transverse uterine incision has the lowest risk of rupture (0.2 to 1.5 percent risk). Vertical or T-shaped uterine incisions have a higher risk of uterine rupture (4 to 9 percent risk)The risk of fetal death is very low with both VBAC and elective repeat cesarean delivery (ERCD), but the likelihood of fetal death is higher with VBAC than with ERCD. Maternal death is very rare with either type of delivery. The risks of an elective repeat cesarean delivery (ERCD) were reviewed with the patient including but not limited to:  01/999 risk of uterine rupture which could have serious consequences, bleeding which may require transfusion; infection which may require antibiotics; injury to bowel, bladder or other surrounding organs (bowel, bladder, ureters); injury to the fetus; need for additional procedures including hysterectomy in the event of a life-threatening hemorrhage; thromboembolic phenomenon; abnormal placentation; incisional problems; death and other postoperative or anesthesia complications.    After discussion of risks/benefits, patient still desires a TOLAC. RTC in 2 weeks with midwives.  [name], Kyriakos Babler, MD [facility]

## 2017-05-21 ENCOUNTER — Encounter: Payer: Medicaid Other | Admitting: Obstetrics and Gynecology

## 2017-05-27 ENCOUNTER — Observation Stay
Admission: EM | Admit: 2017-05-27 | Discharge: 2017-05-27 | Disposition: A | Discharge status: Home / Self Care | Payer: Medicaid Other | Attending: Obstetrics and Gynecology | Admitting: Obstetrics and Gynecology

## 2017-05-27 DIAGNOSIS — Z79899 Other long term (current) drug therapy: Secondary | ICD-10-CM | POA: Insufficient documentation

## 2017-05-27 DIAGNOSIS — K219 Gastro-esophageal reflux disease without esophagitis: Secondary | ICD-10-CM | POA: Diagnosis not present

## 2017-05-27 DIAGNOSIS — R102 Pelvic and perineal pain: Secondary | ICD-10-CM | POA: Diagnosis not present

## 2017-05-27 DIAGNOSIS — O99613 Diseases of the digestive system complicating pregnancy, third trimester: Secondary | ICD-10-CM | POA: Diagnosis not present

## 2017-05-27 DIAGNOSIS — O4703 False labor before 37 completed weeks of gestation, third trimester: Principal | ICD-10-CM | POA: Insufficient documentation

## 2017-05-27 DIAGNOSIS — F419 Anxiety disorder, unspecified: Secondary | ICD-10-CM | POA: Insufficient documentation

## 2017-05-27 DIAGNOSIS — R112 Nausea with vomiting, unspecified: Secondary | ICD-10-CM

## 2017-05-27 DIAGNOSIS — O26893 Other specified pregnancy related conditions, third trimester: Secondary | ICD-10-CM | POA: Insufficient documentation

## 2017-05-27 DIAGNOSIS — O99343 Other mental disorders complicating pregnancy, third trimester: Secondary | ICD-10-CM | POA: Diagnosis not present

## 2017-05-27 DIAGNOSIS — Z3A32 32 weeks gestation of pregnancy: Secondary | ICD-10-CM | POA: Insufficient documentation

## 2017-05-27 DIAGNOSIS — R1030 Lower abdominal pain, unspecified: Secondary | ICD-10-CM | POA: Diagnosis not present

## 2017-05-27 LAB — WET PREP, GENITAL
CLUE CELLS WET PREP: NONE SEEN
Sperm: NONE SEEN
TRICH WET PREP: NONE SEEN
YEAST WET PREP: NONE SEEN

## 2017-05-27 LAB — URINALYSIS, ROUTINE W REFLEX MICROSCOPIC
Bilirubin Urine: NEGATIVE
Glucose, UA: NEGATIVE mg/dL
HGB URINE DIPSTICK: NEGATIVE
Leukocytes, UA: NEGATIVE
Nitrite: NEGATIVE
Protein, ur: NEGATIVE mg/dL
Specific Gravity, Urine: 1.015 (ref 1.005–1.030)
pH: 7 (ref 5.0–8.0)

## 2017-05-27 LAB — FETAL FIBRONECTIN: FETAL FIBRONECTIN: NEGATIVE

## 2017-05-27 MED ORDER — METOCLOPRAMIDE HCL 5 MG/ML IJ SOLN
10.0000 mg | Freq: Three times a day (TID) | INTRAMUSCULAR | Status: DC
  Administered 2017-05-27: 10 mg via INTRAVENOUS

## 2017-05-27 MED ORDER — METOCLOPRAMIDE HCL 10 MG PO TABS: 10.0000 mg | ORAL_TABLET | Freq: Three times a day (TID) | ORAL | 2 refills | Status: DC

## 2017-05-27 MED ORDER — TERBUTALINE SULFATE 1 MG/ML IJ SOLN
0.2500 mg | Freq: Once | INTRAMUSCULAR | Status: AC
  Administered 2017-05-27: 0.25 mg via SUBCUTANEOUS

## 2017-05-27 MED ORDER — HYDROXYZINE HCL 50 MG PO TABS: 50.0000 mg | ORAL_TABLET | Freq: Four times a day (QID) | ORAL | 0 refills | Status: DC | PRN

## 2017-05-27 MED ORDER — SOD CITRATE-CITRIC ACID 500-334 MG/5ML PO SOLN: 30.0000 mL | ORAL | Status: DC | PRN

## 2017-05-27 MED ORDER — DEXTROSE IN LACTATED RINGERS 5 % IV SOLN
INTRAVENOUS | Status: DC
  Administered 2017-05-27: 06:00:00 via INTRAVENOUS

## 2017-05-27 MED ORDER — LACTATED RINGERS IV BOLUS (SEPSIS)
500.0000 mL | Freq: Once | INTRAVENOUS | Status: AC
  Administered 2017-05-27: 500 mL via INTRAVENOUS

## 2017-05-27 MED ORDER — DEXTROSE 5 % IN LACTATED RINGERS IV BOLUS: 1000.0000 mL | Freq: Once | INTRAVENOUS | Status: DC

## 2017-05-27 MED ORDER — ACETAMINOPHEN 500 MG PO TABS: 1000.0000 mg | ORAL_TABLET | Freq: Four times a day (QID) | ORAL | Status: DC | PRN

## 2017-05-27 MED ORDER — METOCLOPRAMIDE HCL 5 MG/ML IJ SOLN: 10.0000 mg | Freq: Three times a day (TID) | INTRAMUSCULAR | 0 refills | Status: DC

## 2017-05-27 MED ORDER — ONDANSETRON HCL 4 MG/2ML IJ SOLN
4.0000 mg | Freq: Four times a day (QID) | INTRAMUSCULAR | Status: DC | PRN
  Administered 2017-05-27: 4 mg via INTRAVENOUS

## 2017-05-27 MED ORDER — HYDROXYZINE HCL 50 MG PO TABS
50.0000 mg | ORAL_TABLET | Freq: Four times a day (QID) | ORAL | Status: DC | PRN
  Filled 2017-05-27: qty 1

## 2017-05-27 NOTE — Discharge Summary (Signed)
Obstetric Discharge Summary  Patient ID: Natasha Casaerissa C Delduca MRN: [id number] DOB/AGE: September 22, 1986 31 y.o.   Date of Admission: 05/27/2017 Serafina RoyalsMichelle Machel Violante, CNM [facility](D. Evans, MD)  Date of Discharge: 05/27/2017 Serafina RoyalsMichelle Antonios Ostrow, CNM [facility](D. Evans, MD)  Admitting Diagnosis: Abdominal pain in pregnancy at 8259w3d  Secondary Diagnosis: Previous c-section, desires trial of labor  Discharge Diagnosis: Contractions and Round ligament pain   Complications: N/A   Brief Hospital Course   L&D OB Triage Note  Subjective:  Natasha Myers is a 31 y.o. 822P1001 female at 3259w3d, EDD Estimated Date of Delivery: 07/19/17, who presented to triage for complaints of left sided abdominal pain and contractions after single episode of vomiting at 0200. She has not been taking her Zofran or Diclegis as prescribed, she declined a prescription for phenergan. She was treated with terbutaline, Zofran, Reglan and IVF x 2 bags (LR and D5LR).   Objective:  Blood pressure 123/60, pulse 100, temperature 98 F (36.7 C), temperature source Oral, resp. rate 20, height 5\' 1"  (1.549 m), weight 173 lb (78.5 kg), last menstrual period 10/12/2016.  Physical exam:   General: alert and no distress  Cardiac: regular rate and rhythm  Respiratory: clear bilaterally  Abdomen: gravid, soft, NT  Pelvic exam: VULVA: normal appearing vulva with no masses, tenderness or lesions, VAGINA: normal appearing vagina with normal color and discharge, no lesions, wet prep collected, CERVIX: normal appearing cervix without discharge or lesions, FFN collected  Dilation: 1 Effacement (%): 50 Station: -3 Presentation: Undeterminable Exam by:: ML CNM  Extremities: No evidence of DVT seen on physical exam. No lower extremity edema.  Mode: External Baseline Rate (A): 135 bpm Variability: Moderate Accelerations: 15 x 15 Decelerations: Variable  Contraction Frequency (min): rare(1)   Labs:  Fetal fibronectin    Ref Range & Units 04:29  Fetal  Fibronectin NEGATIVE NEGATIVE   Appearance, FETFIB CLEAR CLEAR   Resulting Agency  [facility]    Specimen Collected: 05/27/17 04:29  Last Resulted: 05/27/17 05:52                 Wet prep, genital    Ref Range & Units 04:28  Yeast Wet Prep HPF POC NONE SEEN NONE SEEN   Trich, Wet Prep NONE SEEN NONE SEEN   Clue Cells Wet Prep HPF POC NONE SEEN NONE SEEN   WBC, Wet Prep HPF POC NONE SEEN RARE    Sperm  NONE SEEN        Urinalysis, Routine w reflex microscopic    Ref Range & Units 03:45  Color, Urine YELLOW YELLOW   APPearance CLEAR CLEAR   Specific Gravity, Urine 1.005 - 1.030 1.015   pH 5.0 - 8.0 7.0   Glucose, UA NEGATIVE mg/dL NEGATIVE   Hgb urine dipstick NEGATIVE NEGATIVE   Bilirubin Urine NEGATIVE NEGATIVE   Ketones, ur NEGATIVE mg/dL TRACE    Protein, ur NEGATIVE mg/dL NEGATIVE   Nitrite NEGATIVE NEGATIVE   Leukocytes, UA NEGATIVE NEGATIVE         Assessment:  31 y.o. G2P1 6859w3d, NOT IN LABOR, Braxton Hicks Contractions, Round Ligament Pain, Nausea and Vomiting in Pregnancy, Reflux in Pregnancy  FHR Category I  Plan:   She was discharged home with bleeding/labor precautions. Rx Reglan and vistaril, see orders. Continue routine prenatal care. Follow up with CNM as previously scheduled.   Discharge Instructions: Per After Visit Summary.  Activity: Advance as tolerated.  Also refer to After Visit Summary  Diet: Regular  Medications: Allergies as of 05/27/2017  No Known Allergies     Medication List    STOP taking these medications   ranitidine 150 MG capsule Commonly known as:  ZANTAC     TAKE these medications   fluticasone 50 MCG/ACT nasal spray Commonly known as:  FLONASE Place into the nose.   hydrOXYzine 50 MG tablet Commonly known as:  ATARAX/VISTARIL Take 1 tablet (50 mg total) by mouth every 6 (six) hours as needed for anxiety.   metoCLOPramide 10 MG tablet Commonly known as:  REGLAN Take 1 tablet (10 mg total) by mouth 3 (three)  times daily before meals.   multivitamin-prenatal 27-0.8 MG Tabs tablet Take 1 tablet by mouth daily at 12 noon.   ondansetron 4 MG disintegrating tablet Commonly known as:  ZOFRAN ODT Take 1 tablet (4 mg total) by mouth every 6 (six) hours as needed for nausea or vomiting.   pantoprazole 20 MG tablet Commonly known as:  PROTONIX Take 1 tablet (20 mg total) by mouth 2 (two) times daily.      Outpatient follow up:  Follow-up Information    [facility] Follow up in 1 week(s).   Contact information: [address]  [address] [place] [address] [phone]          Discharged Condition: stable  Discharged to: home   Gunnar Bulla, [place]

## 2017-05-28 LAB — URINE CULTURE
CULTURE: NO GROWTH
SPECIAL REQUESTS: NORMAL

## 2017-06-02 ENCOUNTER — Ambulatory Visit (INDEPENDENT_AMBULATORY_CARE_PROVIDER_SITE_OTHER): Payer: Medicaid Other | Admitting: Obstetrics and Gynecology

## 2017-06-02 VITALS — BP 107/60 | HR 98 | Wt 175.2 lb

## 2017-06-02 DIAGNOSIS — Z3493 Encounter for supervision of normal pregnancy, unspecified, third trimester: Secondary | ICD-10-CM | POA: Diagnosis not present

## 2017-06-02 DIAGNOSIS — Z23 Encounter for immunization: Secondary | ICD-10-CM | POA: Diagnosis not present

## 2017-06-02 LAB — POCT URINALYSIS DIPSTICK
BILIRUBIN UA: NEGATIVE
GLUCOSE UA: NEGATIVE
Ketones, UA: 15
Leukocytes, UA: NEGATIVE
Nitrite, UA: NEGATIVE
PH UA: 6 (ref 5.0–8.0)
Protein, UA: NEGATIVE
RBC UA: NEGATIVE
SPEC GRAV UA: 1.02 (ref 1.010–1.025)
UROBILINOGEN UA: 0.2 U/dL

## 2017-06-02 MED ORDER — TETANUS-DIPHTH-ACELL PERTUSSIS 5-2.5-18.5 LF-MCG/0.5 IM SUSP
0.5000 mL | Freq: Once | INTRAMUSCULAR | Status: AC
  Administered 2017-06-02: 0.5 mL via INTRAMUSCULAR

## 2017-06-02 NOTE — Progress Notes (Signed)
ROB-doing well, denies any changes. Discussed circumcision, and pp birth control-used Depo in past . [place] but will stay with mm until delivery. Will do EFW last week of pregnancy. Needs TDAP-given today.

## 2017-06-02 NOTE — Addendum Note (Signed)
Addended by: Rosine BeatLONTZ, Zadok Holaway L on: 06/02/2017 03:53 PM   Modules accepted: Orders

## 2017-06-02 NOTE — Progress Notes (Signed)
ROB- pt is still having a lot of nausea, otherwise she is doing well

## 2017-06-18 ENCOUNTER — Ambulatory Visit (INDEPENDENT_AMBULATORY_CARE_PROVIDER_SITE_OTHER): Payer: Medicaid Other | Admitting: Certified Nurse Midwife

## 2017-06-18 ENCOUNTER — Encounter: Payer: Self-pay | Admitting: Certified Nurse Midwife

## 2017-06-18 VITALS — BP 114/57 | HR 102 | Wt 175.6 lb

## 2017-06-18 DIAGNOSIS — Z3689 Encounter for other specified antenatal screening: Secondary | ICD-10-CM

## 2017-06-18 DIAGNOSIS — Z3493 Encounter for supervision of normal pregnancy, unspecified, third trimester: Secondary | ICD-10-CM

## 2017-06-18 DIAGNOSIS — Z202 Contact with and (suspected) exposure to infections with a predominantly sexual mode of transmission: Secondary | ICD-10-CM

## 2017-06-18 DIAGNOSIS — Z3685 Encounter for antenatal screening for Streptococcus B: Secondary | ICD-10-CM

## 2017-06-18 LAB — POCT URINALYSIS DIPSTICK
Bilirubin, UA: NEGATIVE
Blood, UA: NEGATIVE
Glucose, UA: NEGATIVE
Ketones, UA: NEGATIVE
LEUKOCYTES UA: NEGATIVE
Nitrite, UA: NEGATIVE
PROTEIN UA: NEGATIVE
SPEC GRAV UA: 1.015 (ref 1.010–1.025)
UROBILINOGEN UA: 0.2 U/dL
pH, UA: 6.5 (ref 5.0–8.0)

## 2017-06-18 NOTE — Progress Notes (Signed)
ROB- no complaints.- 36 week cultures.

## 2017-06-18 NOTE — Progress Notes (Signed)
ROB-Pt doing well, reports occasional nausea with uterine irritability. Reflux is better. Currently, staying in town [place]. 36 week cultures collected today. Baby transverse by [place]. Encouraged [email or web address] for optimization of fetal positioning. Reviewed red flag symptoms and when to call. RTC x 1 weeks for Growth/Presentation US and ROB.

## 2017-06-18 NOTE — Patient Instructions (Signed)
Trial of Labor After Cesarean Delivery A trial of labor after cesarean delivery (TOLAC) is when a woman tries to give birth vaginally after a previous cesarean delivery. TOLAC may be a safe and appropriate option for you depending on your medical history and other risk factors. When TOLAC is successful and you are able to have a vaginal delivery, this is called a vaginal birth after cesarean delivery (VBAC). Candidates for TOLAC TOLAC is possible for some women who:  Have undergone one or two prior cesarean deliveries in which the incision of the uterus was horizontal (low transverse).  Are carrying twins and have had one prior low transverse incision during a cesarean delivery.  Do not have a vertical (classical) uterine scar.  Have not had a tear in the wall of their uterus (uterine rupture).  TOLAC is also supported for women who meet appropriate criteria and:  Are under the age of 40 years.  Are tall and have a body mass index (BMI) of less than 30.  Have an unknown uterine scar.  Give birth in a facility equipped to handle an emergency cesarean delivery. This team should be able to handle possible complications such as a uterine rupture.  Have thorough counseling about the benefits and risks of TOLAC.  Have discussed future pregnancy plans with their health care provider.  Plan to have several more pregnancies.  Most successful candidates for TOLAC:  Have had a successful vaginal delivery before or after their cesarean delivery.  Experience labor that begins naturally on or before the due date (40 weeks of gestation).  Do not have a very large (macrosomic) baby.  Had a prior cesarean delivery but are not currently experiencing factors that would prompt a cesarean delivery (such as a breech position).  Had only one prior cesarean delivery.  Had a prior cesarean delivery that was performed early in labor and not after full cervical dilation. TOLAC may be most appropriate  for women who meet the above guidelines and who plan to have more pregnancies. TOLAC is not recommended for home births. Least successful candidates for TOLAC:  Have an induced labor with an unfavorable cervix. An unfavorable cervix is when the cervix is not dilating enough (among other factors).  Have never had a vaginal delivery.  Have had more than two cesarean deliveries.  Have a pregnancy at more than 40 weeks of gestation.  Are pregnant with a baby with a suspected weight greater than 4,000 grams (8 pounds) and who have no prior history of a vaginal delivery.  Have closely spaced pregnancies. Suggested benefits of TOLAC  You may have a faster recovery time.  You may have a shorter stay in the hospital.  You may have less pain and fewer problems than with a cesarean delivery. Women who have a cesarean delivery have a higher chance of needing blood or getting a fever, an infection, or a blood clot in the legs. Suggested risks of TOLAC The highest risk of complications happens to women who attempt a TOLAC and fail. A failed TOLAC results in an unplanned cesarean delivery. Risks related to TOLAC or repeat cesarean deliveries include:  Blood loss.  Infection.  Blood clot.  Injury to surrounding tissues or organs.  Having to remove the uterus (hysterectomy).  Potential problems with the placenta (such as placenta previa or placenta accreta) in future pregnancies.  Although very rare, the main concerns with TOLAC are:  Rupture of the uterine scar from a past cesarean delivery.  Needing an emergency   cesarean delivery.  Having a bad outcome for the baby (perinatal morbidity).  Where to find more information:  American Congress of Obstetricians and Gynecologists: www.acog.org  American College of Nurse-Midwives: www.midwife.org This information is not intended to replace advice given to you by your health care provider. Make sure you discuss any questions you have with  your health care provider. Document Released: 08/11/2011 Document Revised: 10/21/2016 Document Reviewed: 05/15/2013 Elsevier Interactive Patient Education  2018 Elsevier Inc.  

## 2017-06-20 LAB — STREP GP B NAA: Strep Gp B NAA: NEGATIVE

## 2017-06-23 LAB — GC/CHLAMYDIA PROBE AMP
CHLAMYDIA, DNA PROBE: NEGATIVE
NEISSERIA GONORRHOEAE BY PCR: NEGATIVE

## 2017-06-25 ENCOUNTER — Ambulatory Visit (INDEPENDENT_AMBULATORY_CARE_PROVIDER_SITE_OTHER): Payer: Medicaid Other | Admitting: Certified Nurse Midwife

## 2017-06-25 ENCOUNTER — Ambulatory Visit (INDEPENDENT_AMBULATORY_CARE_PROVIDER_SITE_OTHER): Payer: Medicaid Other

## 2017-06-25 VITALS — BP 113/59

## 2017-06-25 DIAGNOSIS — Z3493 Encounter for supervision of normal pregnancy, unspecified, third trimester: Secondary | ICD-10-CM | POA: Diagnosis not present

## 2017-06-25 DIAGNOSIS — Z3689 Encounter for other specified antenatal screening: Secondary | ICD-10-CM | POA: Diagnosis not present

## 2017-06-25 DIAGNOSIS — Z3483 Encounter for supervision of other normal pregnancy, third trimester: Secondary | ICD-10-CM

## 2017-06-25 NOTE — Progress Notes (Signed)
ROB, doing well. No complaints. Has some irregular contractions. Ultrasound today for size EFW: 2995 grams ( 6 lbs. 10 oz) 50th percentile.AFI: Adequate for gestational age at 16.8 cm.  Results reviewed with pt. Labor precautions reviewed. Return in one week with Melody.   Doreene BurkeAnnie Eren Puebla ,CNM    Indications: EFW and AFI for S>D Findings:  Singleton intrauterine pregnancy is visualized with FHR at 145 BPM. Biometrics give an (U/S) Gestational age of [redacted] weeks 6 days, and an (U/S) EDD of 07/17/17; this correlates with the clinically established EDD of 07/19/17  Fetal presentation is vertex, spine left lateral.  EFW: 2995 grams ( 6 lbs. 10 oz) 50th percentile, Williams. Placenta: Posterior, grade 2/3 AFI: Adequate for gestational age at 16.8 cm.  Anatomic survey of the fetal kidneys, stomach and bladder appear WNL. Gender - Female.     Impression: 1. 36 week 6 day Viable Singleton Intrauterine pregnancy by U/S. 2. (U/S) EDD is consistent with Clinically established (LMP) EDD of 07/19/17. 3. EFW: 2995 grams ( 6 lbs. 10 oz.) 50th percentile for growth, Williams 4. AFI: 16.8 cm, adequate for gestational age.  Recommendations: 1.Clinical correlation with the patient's History and Physical Exam.  Revonda Humphreyeresa Elliott, RDMS, RVT

## 2017-06-25 NOTE — Patient Instructions (Signed)

## 2017-06-29 ENCOUNTER — Observation Stay
Admission: EM | Admit: 2017-06-29 | Discharge: 2017-06-30 | Disposition: A | Discharge status: Home / Self Care | Payer: Medicaid Other | Attending: Obstetrics and Gynecology | Admitting: Obstetrics and Gynecology

## 2017-06-29 DIAGNOSIS — Z3A37 37 weeks gestation of pregnancy: Secondary | ICD-10-CM | POA: Insufficient documentation

## 2017-06-29 DIAGNOSIS — R112 Nausea with vomiting, unspecified: Secondary | ICD-10-CM

## 2017-06-29 DIAGNOSIS — O26893 Other specified pregnancy related conditions, third trimester: Secondary | ICD-10-CM

## 2017-06-29 DIAGNOSIS — R12 Heartburn: Secondary | ICD-10-CM

## 2017-06-29 DIAGNOSIS — O2690 Pregnancy related conditions, unspecified, unspecified trimester: Secondary | ICD-10-CM | POA: Diagnosis present

## 2017-06-29 DIAGNOSIS — O212 Late vomiting of pregnancy: Secondary | ICD-10-CM | POA: Diagnosis not present

## 2017-06-29 DIAGNOSIS — Z3490 Encounter for supervision of normal pregnancy, unspecified, unspecified trimester: Secondary | ICD-10-CM

## 2017-06-29 MED ORDER — ZOLPIDEM TARTRATE 5 MG PO TABS
5.0000 mg | ORAL_TABLET | Freq: Every evening | ORAL | Status: DC | PRN
  Administered 2017-06-30: 5 mg via ORAL
  Filled 2017-06-29: qty 1

## 2017-06-29 MED ORDER — ONDANSETRON HCL 4 MG/2ML IJ SOLN
4.0000 mg | Freq: Four times a day (QID) | INTRAMUSCULAR | Status: DC | PRN
  Administered 2017-06-29: 4 mg via INTRAVENOUS
  Filled 2017-06-29: qty 2

## 2017-06-29 MED ORDER — PANTOPRAZOLE SODIUM 40 MG PO TBEC
40.0000 mg | DELAYED_RELEASE_TABLET | Freq: Every day | ORAL | Status: DC
  Administered 2017-06-29: 40 mg via ORAL
  Filled 2017-06-29 (×2): qty 1

## 2017-06-29 MED ORDER — ALUM & MAG HYDROXIDE-SIMETH 200-200-20 MG/5ML PO SUSP
30.0000 mL | ORAL | Status: DC | PRN
  Administered 2017-06-29 – 2017-06-30 (×2): 30 mL via ORAL
  Filled 2017-06-29 (×2): qty 30

## 2017-06-29 MED ORDER — LACTATED RINGERS IV SOLN: INTRAVENOUS | Status: DC

## 2017-06-29 NOTE — OB Triage Provider Note (Signed)
Natasha Myers is a 31 y.o. G2P1001 at 3553w1d who is admitted for observation due to heartburn with vomiting and irregular uterine contractions.  Estimated Date of Delivery: 07/19/17 Fetal presentation is cephalic.  Length of Stay:  0 Days. Admitted 06/29/2017  Subjective:  Patient reports good fetal movement.  She reports irregular but mild uterine contractions, no bleeding and no loss of fluid per vagina. States she has had bad heartburn that has caused her to vomit x 4 today, did not take prescribed protonix. Has been able to keep some food down.   Vitals:  Temperature 98.5 F (36.9 C), temperature source Oral, resp. rate 18, height 5\' 1"  (1.549 m), weight 175 lb (79.4 kg), last menstrual period 10/12/2016. Physical Examination: CONSTITUTIONAL: Well-developed, well-nourished female in no acute distress.  ABDOMEN: Soft, nontender, nondistended, gravid. CERVIX: Dilation: 1 Effacement (%): 50 Station: -2 Presentation: Vertex  Fetal monitoring: FHR: 142 bpm, Variability: moderate, Accelerations: Present, Decelerations: Absent  Uterine activity: 3-7 contractions per hour, resolved with IV hydration  No results found for this or any previous visit (from the past 48 hour(s)).  No results found.  Current scheduled medications . pantoprazole  40 mg Oral Daily    I have reviewed the patient's current medications.  ASSESSMENT: Patient Active Problem List   Diagnosis Date Noted  . Pregnancy 06/29/2017  . Previous cesarean section 05/06/2017  . Ptyalism 03/23/2017  . Pregnant 01/27/2017    PLAN: maalox and protonix given here and stated relief noted. zofran IV administered and patient feeling better. No cervical change noted and patient reassured and discharged home. Continue routine antenatal   Natasha Myers, CNM [facility]

## 2017-07-02 ENCOUNTER — Ambulatory Visit (INDEPENDENT_AMBULATORY_CARE_PROVIDER_SITE_OTHER): Payer: Medicaid Other | Admitting: Certified Nurse Midwife

## 2017-07-02 ENCOUNTER — Encounter: Payer: Self-pay | Admitting: Certified Nurse Midwife

## 2017-07-02 VITALS — BP 101/48 | HR 96 | Wt 176.9 lb

## 2017-07-02 DIAGNOSIS — O219 Vomiting of pregnancy, unspecified: Secondary | ICD-10-CM

## 2017-07-02 DIAGNOSIS — Z3483 Encounter for supervision of other normal pregnancy, third trimester: Secondary | ICD-10-CM

## 2017-07-02 LAB — POCT URINALYSIS DIPSTICK
BILIRUBIN UA: NEGATIVE
Blood, UA: NEGATIVE
GLUCOSE UA: NEGATIVE
Leukocytes, UA: NEGATIVE
Nitrite, UA: NEGATIVE
PH UA: 6.5 (ref 5.0–8.0)
PROTEIN UA: NEGATIVE
Spec Grav, UA: 1.02 (ref 1.010–1.025)
UROBILINOGEN UA: 0.2 U/dL

## 2017-07-02 MED ORDER — ONDANSETRON 4 MG PO TBDP
4.0000 mg | ORAL_TABLET | Freq: Four times a day (QID) | ORAL | 0 refills | Status: DC | PRN
Start: 2017-07-02 — End: 2017-09-10

## 2017-07-02 NOTE — Progress Notes (Signed)
ROB-Pt doing well. Reports nausea and vomiting, no relief with home measures. Rx: Zofran, see orders. 36 week cultures negative, results reviewed with patient. Discussed red flag symptoms and when to call. RTC x 1 week for ROB or sooner if needed.

## 2017-07-02 NOTE — Patient Instructions (Addendum)
Trial of Labor After Cesarean Delivery A trial of labor after cesarean delivery (TOLAC) is when a woman tries to give birth vaginally after a previous cesarean delivery. TOLAC may be a safe and appropriate option for you depending on your medical history and other risk factors. When TOLAC is successful and you are able to have a vaginal delivery, this is called a vaginal birth after cesarean delivery (VBAC). Candidates for TOLAC TOLAC is possible for some women who:  Have undergone one or two prior cesarean deliveries in which the incision of the uterus was horizontal (low transverse).  Are carrying twins and have had one prior low transverse incision during a cesarean delivery.  Do not have a vertical (classical) uterine scar.  Have not had a tear in the wall of their uterus (uterine rupture).  TOLAC is also supported for women who meet appropriate criteria and:  Are under the age of 40 years.  Are tall and have a body mass index (BMI) of less than 30.  Have an unknown uterine scar.  Give birth in a facility equipped to handle an emergency cesarean delivery. This team should be able to handle possible complications such as a uterine rupture.  Have thorough counseling about the benefits and risks of TOLAC.  Have discussed future pregnancy plans with their health care provider.  Plan to have several more pregnancies.  Most successful candidates for TOLAC:  Have had a successful vaginal delivery before or after their cesarean delivery.  Experience labor that begins naturally on or before the due date (40 weeks of gestation).  Do not have a very large (macrosomic) baby.  Had a prior cesarean delivery but are not currently experiencing factors that would prompt a cesarean delivery (such as a breech position).  Had only one prior cesarean delivery.  Had a prior cesarean delivery that was performed early in labor and not after full cervical dilation. TOLAC may be most appropriate  for women who meet the above guidelines and who plan to have more pregnancies. TOLAC is not recommended for home births. Least successful candidates for TOLAC:  Have an induced labor with an unfavorable cervix. An unfavorable cervix is when the cervix is not dilating enough (among other factors).  Have never had a vaginal delivery.  Have had more than two cesarean deliveries.  Have a pregnancy at more than 40 weeks of gestation.  Are pregnant with a baby with a suspected weight greater than 4,000 grams (8 pounds) and who have no prior history of a vaginal delivery.  Have closely spaced pregnancies. Suggested benefits of TOLAC  You may have a faster recovery time.  You may have a shorter stay in the hospital.  You may have less pain and fewer problems than with a cesarean delivery. Women who have a cesarean delivery have a higher chance of needing blood or getting a fever, an infection, or a blood clot in the legs. Suggested risks of TOLAC The highest risk of complications happens to women who attempt a TOLAC and fail. A failed TOLAC results in an unplanned cesarean delivery. Risks related to TOLAC or repeat cesarean deliveries include:  Blood loss.  Infection.  Blood clot.  Injury to surrounding tissues or organs.  Having to remove the uterus (hysterectomy).  Potential problems with the placenta (such as placenta previa or placenta accreta) in future pregnancies.  Although very rare, the main concerns with TOLAC are:  Rupture of the uterine scar from a past cesarean delivery.  Needing an emergency   cesarean delivery.  Having a bad outcome for the baby (perinatal morbidity).  Where to find more information:  American Congress of Obstetricians and Gynecologists: www.acog.org  [employer]: www.midwife.org This information is not intended to replace advice given to you by your health care provider. Make sure you discuss any questions you have with  your health care provider. Document Released: 08/11/2011 Document Revised: 10/21/2016 Document Reviewed: 05/15/2013 Elsevier Interactive Patient Education  2018 Elsevier Inc.  Ondansetron oral dissolving tablet What is this medicine? ONDANSETRON (on DAN se tron) is used to treat nausea and vomiting caused by chemotherapy. It is also used to prevent or treat nausea and vomiting after surgery. This medicine may be used for other purposes; ask your health care provider or pharmacist if you have questions. COMMON BRAND NAME(S): Zofran ODT What should I tell my health care provider before I take this medicine? They need to know if you have any of these conditions: -heart disease -history of irregular heartbeat -liver disease -low levels of magnesium or potassium in the blood -an unusual or allergic reaction to ondansetron, granisetron, other medicines, foods, dyes, or preservatives -pregnant or trying to get pregnant -breast-feeding How should I use this medicine? These tablets are made to dissolve in the mouth. Do not try to push the tablet through the foil backing. With dry hands, peel away the foil backing and gently remove the tablet. Place the tablet in the mouth and allow it to dissolve, then swallow. While you may take these tablets with water, it is not necessary to do so. Talk to your pediatrician regarding the use of this medicine in children. Special care may be needed. Overdosage: If you think you have taken too much of this medicine contact a poison control center or emergency room at once. NOTE: This medicine is only for you. Do not share this medicine with others. What if I miss a dose? If you miss a dose, take it as soon as you can. If it is almost time for your next dose, take only that dose. Do not take double or extra doses. What may interact with this medicine? Do not take this medicine with any of the following medications: -apomorphine -certain medicines for fungal  infections like fluconazole, itraconazole, ketoconazole, posaconazole, voriconazole -cisapride -dofetilide -dronedarone -pimozide -thioridazine -ziprasidone This medicine may also interact with the following medications: -carbamazepine -certain medicines for depression, anxiety, or psychotic disturbances -fentanyl -linezolid -MAOIs like Carbex, Eldepryl, Marplan, Nardil, and Parnate -methylene blue (injected into a vein) -other medicines that prolong the QT interval (cause an abnormal heart rhythm) -phenytoin -rifampicin -tramadol This list may not describe all possible interactions. Give your health care provider a list of all the medicines, herbs, non-prescription drugs, or dietary supplements you use. Also tell them if you smoke, drink alcohol, or use illegal drugs. Some items may interact with your medicine. What should I watch for while using this medicine? Check with your doctor or health care professional as soon as you can if you have any sign of an allergic reaction. What side effects may I notice from receiving this medicine? Side effects that you should report to your doctor or health care professional as soon as possible: -allergic reactions like skin rash, itching or hives, swelling of the face, lips, or tongue -breathing problems -confusion -dizziness -fast or irregular heartbeat -feeling faint or lightheaded, falls -fever and chills -loss of balance or coordination -seizures -sweating -swelling of the hands and feet -tightness in the chest -tremors -unusually weak  or tired Side effects that usually do not require medical attention (report to your doctor or health care professional if they continue or are bothersome): -constipation or diarrhea -headache This list may not describe all possible side effects. Call your doctor for medical advice about side effects. You may report side effects to FDA at 1-800-FDA-1088. Where should I keep my medicine? Keep out of  the reach of children. Store between 2 and 30 degrees C (36 and 86 degrees F). Throw away any unused medicine after the expiration date. NOTE: This sheet is a summary. It may not cover all possible information. If you have questions about this medicine, talk to your doctor, pharmacist, or health care provider.  2018 Elsevier/Gold Standard (2013-08-30 16:21:52)

## 2017-07-02 NOTE — Progress Notes (Signed)
ROB- no complaints. Seen ED 06/29/2017-  C/o ctx. Given fluids and reactive nst. Better

## 2017-07-09 ENCOUNTER — Ambulatory Visit (INDEPENDENT_AMBULATORY_CARE_PROVIDER_SITE_OTHER): Payer: Medicaid Other | Admitting: Certified Nurse Midwife

## 2017-07-09 VITALS — BP 117/62 | HR 100 | Wt 177.1 lb

## 2017-07-09 DIAGNOSIS — O34219 Maternal care for unspecified type scar from previous cesarean delivery: Secondary | ICD-10-CM

## 2017-07-09 DIAGNOSIS — Z3483 Encounter for supervision of other normal pregnancy, third trimester: Secondary | ICD-10-CM

## 2017-07-09 DIAGNOSIS — Z3689 Encounter for other specified antenatal screening: Secondary | ICD-10-CM

## 2017-07-09 LAB — POCT URINALYSIS DIPSTICK
BILIRUBIN UA: NEGATIVE
Blood, UA: NEGATIVE
Glucose, UA: NEGATIVE
KETONES UA: 80
LEUKOCYTES UA: NEGATIVE
Nitrite, UA: NEGATIVE
PH UA: 6 (ref 5.0–8.0)
Protein, UA: NEGATIVE
Spec Grav, UA: 1.025 (ref 1.010–1.025)
Urobilinogen, UA: 0.2 E.U./dL

## 2017-07-09 NOTE — Progress Notes (Signed)
ROB-Pt doing well, reports irregular contractions. Growth US next visit due to history of c-section for large baby. Discussed home labor preparation methods. Reviewed red flag symptoms and when to call. RTC x 1 weeks for Growth US and ROB or sooner if needed.

## 2017-07-09 NOTE — Patient Instructions (Signed)
Trial of Labor After Cesarean Delivery A trial of labor after cesarean delivery (TOLAC) is when a woman tries to give birth vaginally after a previous cesarean delivery. TOLAC may be a safe and appropriate option for you depending on your medical history and other risk factors. When TOLAC is successful and you are able to have a vaginal delivery, this is called a vaginal birth after cesarean delivery (VBAC). Candidates for TOLAC TOLAC is possible for some women who:  Have undergone one or two prior cesarean deliveries in which the incision of the uterus was horizontal (low transverse).  Are carrying twins and have had one prior low transverse incision during a cesarean delivery.  Do not have a vertical (classical) uterine scar.  Have not had a tear in the wall of their uterus (uterine rupture).  TOLAC is also supported for women who meet appropriate criteria and:  Are under the age of 40 years.  Are tall and have a body mass index (BMI) of less than 30.  Have an unknown uterine scar.  Give birth in a facility equipped to handle an emergency cesarean delivery. This team should be able to handle possible complications such as a uterine rupture.  Have thorough counseling about the benefits and risks of TOLAC.  Have discussed future pregnancy plans with their health care provider.  Plan to have several more pregnancies.  Most successful candidates for TOLAC:  Have had a successful vaginal delivery before or after their cesarean delivery.  Experience labor that begins naturally on or before the due date (40 weeks of gestation).  Do not have a very large (macrosomic) baby.  Had a prior cesarean delivery but are not currently experiencing factors that would prompt a cesarean delivery (such as a breech position).  Had only one prior cesarean delivery.  Had a prior cesarean delivery that was performed early in labor and not after full cervical dilation. TOLAC may be most appropriate  for women who meet the above guidelines and who plan to have more pregnancies. TOLAC is not recommended for home births. Least successful candidates for TOLAC:  Have an induced labor with an unfavorable cervix. An unfavorable cervix is when the cervix is not dilating enough (among other factors).  Have never had a vaginal delivery.  Have had more than two cesarean deliveries.  Have a pregnancy at more than 40 weeks of gestation.  Are pregnant with a baby with a suspected weight greater than 4,000 grams (8 pounds) and who have no prior history of a vaginal delivery.  Have closely spaced pregnancies. Suggested benefits of TOLAC  You may have a faster recovery time.  You may have a shorter stay in the hospital.  You may have less pain and fewer problems than with a cesarean delivery. Women who have a cesarean delivery have a higher chance of needing blood or getting a fever, an infection, or a blood clot in the legs. Suggested risks of TOLAC The highest risk of complications happens to women who attempt a TOLAC and fail. A failed TOLAC results in an unplanned cesarean delivery. Risks related to TOLAC or repeat cesarean deliveries include:  Blood loss.  Infection.  Blood clot.  Injury to surrounding tissues or organs.  Having to remove the uterus (hysterectomy).  Potential problems with the placenta (such as placenta previa or placenta accreta) in future pregnancies.  Although very rare, the main concerns with TOLAC are:  Rupture of the uterine scar from a past cesarean delivery.  Needing an emergency   cesarean delivery.  Having a bad outcome for the baby (perinatal morbidity).  Where to find more information:  American Congress of Obstetricians and Gynecologists: www.acog.org  American College of Nurse-Midwives: www.midwife.org This information is not intended to replace advice given to you by your health care provider. Make sure you discuss any questions you have with  your health care provider. Document Released: 08/11/2011 Document Revised: 10/21/2016 Document Reviewed: 05/15/2013 Elsevier Interactive Patient Education  2018 Elsevier Inc.  

## 2017-07-15 ENCOUNTER — Ambulatory Visit (INDEPENDENT_AMBULATORY_CARE_PROVIDER_SITE_OTHER): Payer: Medicaid Other

## 2017-07-15 ENCOUNTER — Ambulatory Visit (INDEPENDENT_AMBULATORY_CARE_PROVIDER_SITE_OTHER): Payer: Medicaid Other | Admitting: Certified Nurse Midwife

## 2017-07-15 VITALS — BP 107/60 | HR 90 | Wt 176.0 lb

## 2017-07-15 DIAGNOSIS — Z3483 Encounter for supervision of other normal pregnancy, third trimester: Secondary | ICD-10-CM

## 2017-07-15 DIAGNOSIS — Z3689 Encounter for other specified antenatal screening: Secondary | ICD-10-CM | POA: Diagnosis not present

## 2017-07-15 DIAGNOSIS — O34219 Maternal care for unspecified type scar from previous cesarean delivery: Secondary | ICD-10-CM

## 2017-07-15 DIAGNOSIS — O219 Vomiting of pregnancy, unspecified: Secondary | ICD-10-CM | POA: Diagnosis not present

## 2017-07-15 LAB — POCT URINALYSIS DIPSTICK
BILIRUBIN UA: NEGATIVE
GLUCOSE UA: NEGATIVE
KETONES UA: NEGATIVE
Leukocytes, UA: NEGATIVE
Nitrite, UA: NEGATIVE
Spec Grav, UA: 1.025 (ref 1.010–1.025)
Urobilinogen, UA: 0.2 E.U./dL
pH, UA: 6.5 (ref 5.0–8.0)

## 2017-07-15 MED ORDER — ONDANSETRON HCL 4 MG/2ML IJ SOLN
4.0000 mg | Freq: Once | INTRAMUSCULAR | Status: AC
  Administered 2017-07-15: 4 mg via INTRAMUSCULAR

## 2017-07-15 NOTE — Patient Instructions (Signed)
Common Medications Safe in Pregnancy  Acne:      Constipation:  Benzoyl Peroxide     Colace  Clindamycin      Dulcolax Suppository  Topica Erythromycin     Fibercon  Salicylic Acid      Metamucil         Miralax AVOID:        Senakot   Accutane    Cough:  Retin-A       Cough Drops  Tetracycline      Phenergan w/ Codeine if Rx  Minocycline      Robitussin (Plain & DM)  Antibiotics:     Crabs/Lice:  Ceclor       RID  Cephalosporins    AVOID:  E-Mycins      Kwell  Keflex  Macrobid/Macrodantin   Diarrhea:  Penicillin      Kao-Pectate  Zithromax      Imodium AD         PUSH FLUIDS AVOID:       Cipro     Fever:  Tetracycline      Tylenol (Regular or Extra  Minocycline       Strength)  Levaquin      Extra Strength-Do not          Exceed 8 tabs/24 hrs Caffeine:        <200mg/day (equiv. To 1 cup of coffee or  approx. 3 12 oz sodas)         Gas: Cold/Hayfever:       Gas-X  Benadryl      Mylicon  Claritin       Phazyme  **Claritin-D        Chlor-Trimeton    Headaches:  Dimetapp      ASA-Free Excedrin  Drixoral-Non-Drowsy     Cold Compress  Mucinex (Guaifenasin)     Tylenol (Regular or Extra  Sudafed/Sudafed-12 Hour     Strength)  **Sudafed PE Pseudoephedrine   Tylenol Cold & Sinus     Vicks Vapor Rub  Zyrtec  **AVOID if Problems With Blood Pressure         Heartburn: Avoid lying down for at least 1 hour after meals  Aciphex      Maalox     Rash:  Milk of Magnesia     Benadryl    Mylanta       1% Hydrocortisone Cream  Pepcid  Pepcid Complete   Sleep Aids:  Prevacid      Ambien   Prilosec       Benadryl  Rolaids       Chamomile Tea  Tums (Limit 4/day)     Unisom  Zantac       Tylenol PM         Warm milk-add vanilla or  Hemorrhoids:       Sugar for taste  Anusol/Anusol H.C.  (RX: Analapram 2.5%)  Sugar Substitutes:  Hydrocortisone OTC     Ok in moderation  Preparation H      Tucks        Vaseline lotion applied to tissue with  wiping    Herpes:     Throat:  Acyclovir      Oragel  Famvir  Valtrex     Vaccines:         Flu Shot Leg Cramps:       *Gardasil  Benadryl      Hepatitis A         Hepatitis B Nasal Spray:         Pneumovax  Saline Nasal Spray     Polio Booster         Tetanus Nausea:       Tuberculosis test or PPD  Vitamin B6 25 mg TID   AVOID:    Dramamine      *Gardasil  Emetrol       Live Poliovirus  Ginger Root 250 mg QID    MMR (measles, mumps &  High Complex Carbs @ Bedtime    rebella)  Sea Bands-Accupressure    Varicella (Chickenpox)  Unisom 1/2 tab TID     *No known complications           If received before Pain:         Known pregnancy;   Darvocet       Resume series after  Lortab        Delivery  Percocet    Yeast:   Tramadol      Femstat  Tylenol 3      Gyne-lotrimin  Ultram       Monistat  Vicodin           MISC:         All Sunscreens           Hair Coloring/highlights          Insect Repellant's          (Including DEET)         Mystic Tans Food Choices for Gastroesophageal Reflux Disease, Adult When you have gastroesophageal reflux disease (GERD), the foods you eat and your eating habits are very important. Choosing the right foods can help ease the discomfort of GERD. Consider working with a diet and nutrition specialist (dietitian) to help you make healthy food choices. What general guidelines should I follow? Eating plan  Choose healthy foods low in fat, such as fruits, vegetables, whole grains, low-fat dairy products, and lean meat, fish, and poultry.  Eat frequent, small meals instead of three large meals each day. Eat your meals slowly, in a relaxed setting. Avoid bending over or lying down until 2-3 hours after eating.  Limit high-fat foods such as fatty meats or fried foods.  Limit your intake of oils, butter, and shortening to less than 8 teaspoons each day.  Avoid the following: ? Foods that cause symptoms. These may be different for different people. Keep a  food diary to keep track of foods that cause symptoms. ? Alcohol. ? Drinking large amounts of liquid with meals. ? Eating meals during the 2-3 hours before bed.  Cook foods using methods other than frying. This may include baking, grilling, or broiling. Lifestyle   Maintain a healthy weight. Ask your health care provider what weight is healthy for you. If you need to lose weight, work with your health care provider to do so safely.  Exercise for at least 30 minutes on 5 or more days each week, or as told by your health care provider.  Avoid wearing clothes that fit tightly around your waist and chest.  Do not use any products that contain nicotine or tobacco, such as cigarettes and e-cigarettes. If you need help quitting, ask your health care provider.  Sleep with the head of your bed raised. Use a wedge under the mattress or blocks under the bed frame to raise the head of the bed. What foods are not recommended? The items listed may not be a complete list. Talk with your dietitian about what dietary choices are best for you.  Grains Pastries or quick breads with added fat. [place] toast. Vegetables Deep fried vegetables. [place] fries. Any vegetables prepared with added fat. Any vegetables that cause symptoms. For some people this may include tomatoes and tomato products, chili peppers, onions and garlic, and horseradish. Fruits Any fruits prepared with added fat. Any fruits that cause symptoms. For some people this may include citrus fruits, such as oranges, grapefruit, pineapple, and lemons. Meats and other protein foods High-fat meats, such as fatty beef or pork, hot dogs, ribs, ham, sausage, salami and bacon. Fried meat or protein, including fried fish and fried chicken. Nuts and nut butters. Dairy Whole milk and chocolate milk. Sour cream. Cream. Ice cream. Cream cheese. Milk shakes. Beverages Coffee and tea, with or without caffeine. Carbonated beverages. Sodas. Energy drinks.  Fruit juice made with acidic fruits (such as orange or grapefruit). Tomato juice. Alcoholic drinks. Fats and oils Butter. Margarine. Shortening. Ghee. Sweets and desserts Chocolate and cocoa. Donuts. Seasoning and other foods Pepper. Peppermint and spearmint. Any condiments, herbs, or seasonings that cause symptoms. For some people, this may include curry, hot sauce, or vinegar-based salad dressings. Summary  When you have gastroesophageal reflux disease (GERD), food and lifestyle choices are very important to help ease the discomfort of GERD.  Eat frequent, small meals instead of three large meals each day. Eat your meals slowly, in a relaxed setting. Avoid bending over or lying down until 2-3 hours after eating.  Limit high-fat foods such as fatty meat or fried foods. This information is not intended to replace advice given to you by your health care provider. Make sure you discuss any questions you have with your health care provider. Document Released: 11/23/2005 Document Revised: 11/24/2016 Document Reviewed: 11/24/2016 Elsevier Interactive Patient Education  2017 [employer].

## 2017-07-16 ENCOUNTER — Encounter: Payer: Medicaid Other | Admitting: Certified Nurse Midwife

## 2017-07-16 ENCOUNTER — Telehealth: Payer: Self-pay | Admitting: Certified Nurse Midwife

## 2017-07-16 NOTE — Progress Notes (Signed)
ROB-Pt reports reflux and nausea with vomiting this morning. She took protonix this morning, but has not taken any Zofran or Phenergan.  Zofran 4mg  IM x 1 dose now, see orders. Encouraged to increased Protonix to 40 mg BID until birth. Growth [place] completed today, 65th percentile with normal AFI. Previous c-section for 9+6lb infant. Desires TOLAC and IOL prior to 41 weeks. Reviewed red flag symptoms and when to call. RTC x 1 week for ROB and NST. Will complete Induction Assessment for and contact patient with date of induction.   ULTRASOUND REPORT  Location: [facility] Date of Service: 07/15/17  Indications:Growth and AFI Findings:  Mason JimSingleton intrauterine pregnancy is visualized with FHR at 144 BPM. Biometrics give an (U/S) Gestational age of 30 5/7 weeks and an (U/S) EDD of 07/17/17; this correlates with the clinically established EDD of 07/19/17.  Fetal presentation is Vertex.  EFW: 3656g (8lb 1oz) Williams 65th percentile. Placenta: Right lateral, grade 1, remote to cervix. AFI: 14.5 cm.  Fetal stomach and bladder are visualized.  Impression: 1. 39 5/7 week Viable Singleton Intrauterine pregnancy by U/S. 2. (U/S) EDD is consistent with Clinically established (LMP) EDD of 07/19/17. 3. Adequate growth and AFI  Recommendations: 1.Clinical correlation with the patient's History and Physical Exam.

## 2017-07-22 ENCOUNTER — Inpatient Hospital Stay
Admission: EM | Admit: 2017-07-22 | Discharge: 2017-07-25 | DRG: 775 | Disposition: A | Discharge status: Home / Self Care | Payer: Medicaid Other | Attending: Obstetrics and Gynecology | Admitting: Obstetrics and Gynecology

## 2017-07-22 DIAGNOSIS — O34219 Maternal care for unspecified type scar from previous cesarean delivery: Secondary | ICD-10-CM | POA: Diagnosis not present

## 2017-07-22 DIAGNOSIS — Z98891 History of uterine scar from previous surgery: Secondary | ICD-10-CM

## 2017-07-22 DIAGNOSIS — K219 Gastro-esophageal reflux disease without esophagitis: Secondary | ICD-10-CM | POA: Diagnosis present

## 2017-07-22 DIAGNOSIS — Z3A40 40 weeks gestation of pregnancy: Secondary | ICD-10-CM | POA: Diagnosis not present

## 2017-07-22 DIAGNOSIS — O34211 Maternal care for low transverse scar from previous cesarean delivery: Secondary | ICD-10-CM | POA: Diagnosis present

## 2017-07-22 DIAGNOSIS — O9962 Diseases of the digestive system complicating childbirth: Secondary | ICD-10-CM | POA: Diagnosis present

## 2017-07-22 DIAGNOSIS — O480 Post-term pregnancy: Secondary | ICD-10-CM | POA: Diagnosis present

## 2017-07-22 DIAGNOSIS — O219 Vomiting of pregnancy, unspecified: Secondary | ICD-10-CM | POA: Diagnosis present

## 2017-07-22 LAB — TYPE AND SCREEN
ABO/RH(D): B POS
Antibody Screen: NEGATIVE

## 2017-07-22 LAB — CBC
HCT: 30.3 % — ABNORMAL LOW (ref 35.0–47.0)
Hemoglobin: 10.3 g/dL — ABNORMAL LOW (ref 12.0–16.0)
MCH: 30.4 pg (ref 26.0–34.0)
MCHC: 34.2 g/dL (ref 32.0–36.0)
MCV: 89 fL (ref 80.0–100.0)
PLATELETS: 238 10*3/uL (ref 150–440)
RBC: 3.4 MIL/uL — ABNORMAL LOW (ref 3.80–5.20)
RDW: 15.6 % — AB (ref 11.5–14.5)
WBC: 7.5 10*3/uL (ref 3.6–11.0)

## 2017-07-22 LAB — URINALYSIS, ROUTINE W REFLEX MICROSCOPIC
BACTERIA UA: NONE SEEN
BILIRUBIN URINE: NEGATIVE
Glucose, UA: NEGATIVE mg/dL
HGB URINE DIPSTICK: NEGATIVE
Ketones, ur: 5 mg/dL — AB
Nitrite: NEGATIVE
PROTEIN: 30 mg/dL — AB
RBC / HPF: NONE SEEN RBC/hpf (ref 0–5)
Specific Gravity, Urine: 1.021 (ref 1.005–1.030)
pH: 5 (ref 5.0–8.0)

## 2017-07-22 MED ORDER — ONDANSETRON HCL 4 MG/2ML IJ SOLN
4.0000 mg | Freq: Four times a day (QID) | INTRAMUSCULAR | Status: DC | PRN
Start: 2017-07-22 — End: 2017-07-24
  Administered 2017-07-22: 4 mg via INTRAVENOUS
  Filled 2017-07-22: qty 2

## 2017-07-22 MED ORDER — LIDOCAINE HCL (PF) 1 % IJ SOLN
30.0000 mL | INTRAMUSCULAR | Status: AC | PRN
  Administered 2017-07-23: 3 mL via SUBCUTANEOUS

## 2017-07-22 MED ORDER — OXYTOCIN 40 UNITS IN LACTATED RINGERS INFUSION - SIMPLE MED
1.0000 m[IU]/min | INTRAVENOUS | Status: DC
  Administered 2017-07-23: 1 m[IU]/min via INTRAVENOUS
  Filled 2017-07-22: qty 1000

## 2017-07-22 MED ORDER — SOD CITRATE-CITRIC ACID 500-334 MG/5ML PO SOLN
30.0000 mL | ORAL | Status: DC | PRN
Start: 2017-07-22 — End: 2017-07-24
  Administered 2017-07-23 (×2): 30 mL via ORAL
  Filled 2017-07-22 (×2): qty 15

## 2017-07-22 MED ORDER — DIPHENHYDRAMINE HCL 25 MG PO CAPS
25.0000 mg | ORAL_CAPSULE | Freq: Four times a day (QID) | ORAL | Status: DC | PRN
  Administered 2017-07-23 – 2017-07-24 (×2): 25 mg via ORAL
  Filled 2017-07-22 (×2): qty 1

## 2017-07-22 MED ORDER — LACTATED RINGERS IV SOLN
INTRAVENOUS | Status: DC
  Administered 2017-07-23 (×3): via INTRAVENOUS

## 2017-07-22 MED ORDER — FENTANYL CITRATE (PF) 100 MCG/2ML IJ SOLN
50.0000 ug | INTRAMUSCULAR | Status: DC | PRN
  Administered 2017-07-23: 100 ug via INTRAVENOUS
  Filled 2017-07-22: qty 2

## 2017-07-22 MED ORDER — LACTATED RINGERS IV SOLN: INTRAVENOUS | Status: DC

## 2017-07-22 MED ORDER — LACTATED RINGERS IV SOLN
500.0000 mL | INTRAVENOUS | Status: DC | PRN
  Administered 2017-07-22: 500 mL via INTRAVENOUS

## 2017-07-22 MED ORDER — OXYTOCIN 40 UNITS IN LACTATED RINGERS INFUSION - SIMPLE MED
2.5000 [IU]/h | INTRAVENOUS | Status: DC
  Administered 2017-07-24: 2.5 [IU]/h via INTRAVENOUS
  Filled 2017-07-22: qty 1000

## 2017-07-22 MED ORDER — OXYTOCIN BOLUS FROM INFUSION: 500.0000 mL | Freq: Once | INTRAVENOUS | Status: DC

## 2017-07-22 MED ORDER — ACETAMINOPHEN 325 MG PO TABS
650.0000 mg | ORAL_TABLET | ORAL | Status: DC | PRN
  Administered 2017-07-22 – 2017-07-23 (×3): 650 mg via ORAL
  Filled 2017-07-22 (×3): qty 2

## 2017-07-22 MED ORDER — TERBUTALINE SULFATE 1 MG/ML IJ SOLN: 0.2500 mg | Freq: Once | INTRAMUSCULAR | Status: DC | PRN

## 2017-07-22 NOTE — H&P (Signed)
Obstetric History and Physical  Louie Casaerissa C Rohlfs is a 31 y.o. G2P1001 with IUP at 63106w3d presenting with irregular contractions and nausea with vomiting. Patient states she has been having  irregular, every 3-6 minutes contractions, none vaginal bleeding, intact membranes, with active fetal movement.    Prenatal Course [facility]  Pregnancy complications or risks:hyperemesis persistent to date, previous LTCS  Prenatal labs and studies: ABO, Rh: --/--/B POS (02/17 1159) Antibody:  neg Rubella:  immune RPR:   NR HBsAg:   neg HIV:   neg ZOX:[id number]:Negative (07/13 0932) 1 hr Glucola  normal Genetic screening normal Anatomy US normal  Past Medical History:  Diagnosis Date  . GERD (gastroesophageal reflux disease)   . History of abnormal cervical Pap smear     Past Surgical History:  Procedure Laterality Date  . CESAREAN SECTION    . CHOLECYSTECTOMY    . COLPOSCOPY      OB History  Gravida Para Term Preterm AB Living  2 1 1     1   SAB TAB Ectopic Multiple Live Births          1    # Outcome Date GA Lbr Len/2nd Weight Sex Delivery Anes PTL Lv  2 Current           1 Term 04/19/07 5339w0d  9 lb 6.4 oz (4.264 kg) M CS-Unspec  N LIV    Obstetric Comments  Arrest of descent at 2nd stage (failed vacuum).     Social History   Social History  . Marital status: Single    Spouse name: N/A  . Number of children: N/A  . Years of education: N/A   Social History Main Topics  . Smoking status: Never Smoker  . Smokeless tobacco: Never Used  . Alcohol use No  . Drug use: No  . Sexual activity: Yes    Partners: Male    Birth control/ protection: None   Other Topics Concern  . None   Social History Narrative  . None    Family History  Problem Relation Age of Onset  . Diabetes Other        paternal side  . Hypertension Mother   . Heart failure Father   . Rheum arthritis Father   . Hypertension Maternal Grandfather   . Cancer Paternal Grandmother        pancreatic  .  Diabetes Paternal Grandmother   . Diabetes Paternal Grandfather   . Heart failure Paternal Grandfather   . Stroke Maternal Aunt   . Thyroid disease Maternal Aunt     Prescriptions Prior to Admission  Medication Sig Dispense Refill Last Dose  . fluticasone (FLONASE) 50 MCG/ACT nasal spray Place into the nose.   Past Month at Unknown time  . metoCLOPramide (REGLAN) 10 MG tablet Take 1 tablet (10 mg total) by mouth 3 (three) times daily before meals. 30 tablet 2 Past Week at Unknown time  . ondansetron (ZOFRAN ODT) 4 MG disintegrating tablet Take 1 tablet (4 mg total) by mouth every 6 (six) hours as needed for nausea. 20 tablet 0 07/22/2017 at Unknown time  . pantoprazole (PROTONIX) 20 MG tablet Take 1 tablet (20 mg total) by mouth 2 (two) times daily. 60 tablet 4 07/22/2017 at Unknown time  . Prenatal Vit-Fe Fumarate-FA (MULTIVITAMIN-PRENATAL) 27-0.8 MG TABS tablet Take 1 tablet by mouth daily at 12 noon.   07/22/2017 at Unknown time    No Known Allergies  Review of Systems: Negative except for what is  mentioned in HPI.  Physical Exam: BP 103/63   Pulse 99   Temp (P) 98.3 F (36.8 C) (Oral)   Resp 16   Ht 5\' 1"  (1.549 m)   Wt 176 lb (79.8 kg)   LMP 10/12/2016   BMI 33.25 kg/m  GENERAL: Well-developed, sickly female in mild distress.Mucus membranes dry  LUNGS: Clear to auscultation bilaterally.  HEART: Regular rate and rhythm. ABDOMEN: Soft, nontender, nondistended, gravid.heaving intermittent EXTREMITIES: Nontender, no edema, 2+ distal pulses. Cervical Exam: Dilation: 2 Effacement (%): 50 Cervical Position: Posterior Station: -3 Presentation: Vertex Exam by:: Maximino Greenland RN FHT:  Baseline rate 130 bpm   Variability moderate  Accelerations present   Decelerations none Contractions: Every 3-6 mins   Pertinent Labs/Studies:   No results found for this or any previous visit (from the past 24 hour(s)).  Assessment : MELENI DELAHUNT is a 31 y.o. G2P1001 at [redacted]w[redacted]d being admitted for  dehydration, hyperemesis, prodromal early labor with previous LTCS, desiring TOLAC  Plan: Labor: Expectant management.  Induction/Augmentation as needed, per protocol FWB: Reassuring fetal heart tracing.  GBS negative Delivery plan: Hopeful for vaginal delivery  Lasharn Bufkin, CNM [facility]

## 2017-07-22 NOTE — Progress Notes (Signed)
Spoke with Melody, CNM and pt updated on plan for shift, explained per provider, will start Pitocin at 0600a, pt may have bland regular diet now and then clear liquids after midnight, allow pt to rest, may have Benadryl 25mg  po and Tylenol 650mg  po if needed. Continuous monitoring and continued IVF tonight. Pt verbalized understanding and agreement with plan.

## 2017-07-22 NOTE — OB Triage Note (Signed)
Pt is a G2P1 4518w3d that presents from the ED c/o ctx. Ctx started this morning around 1030. Pt denies VB, LOF. +FM.

## 2017-07-23 ENCOUNTER — Other Ambulatory Visit: Payer: Medicaid Other

## 2017-07-23 ENCOUNTER — Encounter: Payer: Medicaid Other | Admitting: Certified Nurse Midwife

## 2017-07-23 ENCOUNTER — Inpatient Hospital Stay: Payer: Medicaid Other | Admitting: Anesthesiology

## 2017-07-23 MED ORDER — FENTANYL 2.5 MCG/ML W/ROPIVACAINE 0.15% IN NS 100 ML EPIDURAL (ARMC)
12.0000 mL/h | EPIDURAL | Status: DC
  Administered 2017-07-23: 12 mL/h via EPIDURAL

## 2017-07-23 MED ORDER — BUPIVACAINE HCL (PF) 0.25 % IJ SOLN
INTRAMUSCULAR | Status: DC | PRN
  Administered 2017-07-23: 4 mL via EPIDURAL
  Administered 2017-07-23: 3 mL via EPIDURAL

## 2017-07-23 MED ORDER — PHENYLEPHRINE 40 MCG/ML (10ML) SYRINGE FOR IV PUSH (FOR BLOOD PRESSURE SUPPORT)
80.0000 ug | PREFILLED_SYRINGE | INTRAVENOUS | Status: DC | PRN
  Filled 2017-07-23: qty 5

## 2017-07-23 MED ORDER — EPHEDRINE 5 MG/ML INJ
10.0000 mg | INTRAVENOUS | Status: DC | PRN
  Filled 2017-07-23: qty 2

## 2017-07-23 MED ORDER — MISOPROSTOL 200 MCG PO TABS
ORAL_TABLET | ORAL | Status: AC
  Filled 2017-07-23: qty 4

## 2017-07-23 MED ORDER — DIPHENHYDRAMINE HCL 50 MG/ML IJ SOLN: 12.5000 mg | INTRAMUSCULAR | Status: DC | PRN

## 2017-07-23 MED ORDER — LIDOCAINE-EPINEPHRINE (PF) 1.5 %-1:200000 IJ SOLN
INTRAMUSCULAR | Status: DC | PRN
  Administered 2017-07-23: 4 mL via EPIDURAL

## 2017-07-23 MED ORDER — AMMONIA AROMATIC IN INHA
RESPIRATORY_TRACT | Status: AC
  Filled 2017-07-23: qty 10

## 2017-07-23 MED ORDER — FENTANYL 2.5 MCG/ML W/ROPIVACAINE 0.15% IN NS 100 ML EPIDURAL (ARMC)
EPIDURAL | Status: AC
  Filled 2017-07-23: qty 100

## 2017-07-23 MED ORDER — LIDOCAINE HCL (PF) 1 % IJ SOLN
INTRAMUSCULAR | Status: AC
  Filled 2017-07-23: qty 30

## 2017-07-23 MED ORDER — OXYTOCIN 10 UNIT/ML IJ SOLN
INTRAMUSCULAR | Status: AC
  Filled 2017-07-23: qty 2

## 2017-07-23 MED ORDER — LACTATED RINGERS IV SOLN: 500.0000 mL | Freq: Once | INTRAVENOUS | Status: DC

## 2017-07-23 NOTE — Anesthesia Procedure Notes (Signed)
Epidural Patient location during procedure: OB  Staffing Performed: anesthesiologist   Preanesthetic Checklist Completed: patient identified, site marked, surgical consent, pre-op evaluation, timeout performed, IV checked, risks and benefits discussed and monitors and equipment checked  Epidural Patient position: sitting Prep: Betadine Patient monitoring: heart rate, continuous pulse ox and blood pressure Approach: midline Location: L4-L5 Injection technique: LOR saline  Needle:  Needle type: Tuohy  Needle gauge: 18 G Needle length: 9 cm and 9 Needle insertion depth: 5 cm Catheter type: closed end flexible Catheter size: 20 Guage Catheter at skin depth: 11 cm Test dose: negative and 1.5% lidocaine with Epi 1:200 K  Assessment Sensory level: T10 Events: blood not aspirated, injection not painful, no injection resistance, negative IV test and no paresthesia  Additional Notes   Patient tolerated the insertion well without complications.-SATD -IVTD. No paresthesia. Refer to [facility] nursing for VS and dosingReason for block:procedure for pain     

## 2017-07-23 NOTE — Progress Notes (Signed)
LELSIE MACKRELL is a 31 y.o. G2P1001 at [redacted]w[redacted]d by LMP admitted for induction of labor due to Post dates and hyperemesis & TOLAC.  Subjective: Sleeping through contractions  Objective: BP 115/78   Pulse 88   Temp 98.3 F (36.8 C) (Oral)   Resp 16   Ht 5\' 1"  (1.549 m)   Wt 176 lb (79.8 kg)   LMP 10/12/2016   BMI 33.25 kg/m  No intake/output data recorded. No intake/output data recorded.  FHT:  FHR: 135 bpm, variability: moderate,  accelerations:  Present,  decelerations:  Absent UC:   irregular, every 6-8 minutes SVE:   Dilation: 2 Effacement (%): 50 Station: -3 Exam by:: Maximino Greenland RN  Labs: Lab Results  Component Value Date   WBC 7.5 07/22/2017   HGB 10.3 (L) 07/22/2017   HCT 30.3 (L) 07/22/2017   MCV 89.0 07/22/2017   PLT 238 07/22/2017    Assessment / Plan: Induction of labor due to term with favorable cervix,  progressing well on pitocin  Labor: Progressing normally Preeclampsia:  labs stable Fetal Wellbeing:  Category I Pain Control:  Labor support without medications I/D:  n/a Anticipated MOD:  NSVD  Melody N Shambley 07/23/2017, 7:36 AM

## 2017-07-23 NOTE — Anesthesia Preprocedure Evaluation (Signed)

## 2017-07-23 NOTE — Progress Notes (Signed)
Natasha Myers is a 31 y.o. G2P1001 at [redacted]w[redacted]d by LMP admitted for induction of labor due to Post dates.  Subjective: Rates pain an 8 on pain scale, request epidural  Objective: BP 106/63   Pulse 76   Temp 98.4 F (36.9 C) (Oral)   Resp 18   Ht 5\' 1"  (1.549 m)   Wt 176 lb (79.8 kg)   LMP 10/12/2016   BMI 33.25 kg/m  No intake/output data recorded. No intake/output data recorded.  FHT:  FHR: 128 bpm, variability: moderate,  accelerations:  Present,  decelerations:  Absent UC:   irregular, every 2-6 minutes on 20 mu/min pitocin SVE:   Dilation: 5 Effacement (%): 60 Station: -2 Exam by:: Toussaint Golson, AROM with moderate amount thin MSAF, IUPC and FSE placed  Labs: Lab Results  Component Value Date   WBC 7.5 07/22/2017   HGB 10.3 (L) 07/22/2017   HCT 30.3 (L) 07/22/2017   MCV 89.0 07/22/2017   PLT 238 07/22/2017    Assessment / Plan: Induction of labor due to term with favorable cervix,  progressing well on pitocin  Labor: Progressing normally Preeclampsia:  labs stable Fetal Wellbeing:  Category I Pain Control:  IV pain meds will call for epidural. I/D:  n/a Anticipated MOD:  NSVD  Eternity Dexter N Seyon Strader 07/23/2017, 6:07 PM

## 2017-07-23 NOTE — Progress Notes (Signed)
Natasha Myers is a 31 y.o. G2P1001 at [redacted]w[redacted]d by LMP admitted for induction of labor due to Post dates.  Subjective:  Reports feeling nausea and subsequently vomited Objective: BP 108/67   Pulse 75   Temp 97.9 F (36.6 C) (Axillary)   Resp 18   Ht 5\' 1"  (1.549 m)   Wt 176 lb (79.8 kg)   LMP 10/12/2016   SpO2 100%   BMI 33.25 kg/m  I/O last 3 completed shifts: In: 1218.8 [I.V.:1218.8] Out: -  Total I/O In: 245.8 [I.V.:245.8] Out: -   FHT:  FHR: 125 bpm, variability: moderate,  accelerations:  Present,  decelerations:  Present early UC:   regular, every 3-5 minutes SVE:   Dilation: 10 Effacement (%): 100 Station: -2 Exam by:: [employer] CNM  Labs: Lab Results  Component Value Date   WBC 7.5 07/22/2017   HGB 10.3 (L) 07/22/2017   HCT 30.3 (L) 07/22/2017   MCV 89.0 07/22/2017   PLT 238 07/22/2017    Assessment / Plan: Induction of labor due to term with favorable cervix,  progressing well on pitocin will begin pushing. MAD on unit and aware.  Labor: Progressing normally Preeclampsia:  labs stable Fetal Wellbeing:  Category I Pain Control:  Epidural I/D:  n/a Anticipated MOD:  NSVD  Melody N Shambley 07/23/2017, 9:26 PM

## 2017-07-23 NOTE — Progress Notes (Signed)
Natasha Myers is a 31 y.o. G2P1001 at [redacted]w[redacted]d by LMP admitted for induction of labor due to Post dates.  Subjective: Denies pain since epidural placed  Objective: BP (!) 132/56 (BP Location: Right Arm)   Pulse 75   Temp 97.9 F (36.6 C) (Oral)   Resp 18   Ht 5\' 1"  (1.549 m)   Wt 176 lb (79.8 kg)   LMP 10/12/2016   SpO2 100%   BMI 33.25 kg/m  No intake/output data recorded. No intake/output data recorded.  FHT:  FHR: 121 bpm, variability: FHR: 121 bpm, variability: moderate,  accelerations:  Present,  decelerations:  Absent,  accelerations:  Present,  decelerations:  Absent UC:   irregular, every 2-4 minutes with 74mu/min pitocin and 180-220 MVU SVE:   Dilation: 7 Effacement (%): 60 Station: -2 Exam by:: [employer] CNM  Labs: Lab Results  Component Value Date   WBC 7.5 07/22/2017   HGB 10.3 (L) 07/22/2017   HCT 30.3 (L) 07/22/2017   MCV 89.0 07/22/2017   PLT 238 07/22/2017    Assessment / Plan: progressing in labor with little fetal desent, counseled on giving labor a few more hours and see if optimal positioning and foley bladder placement will encourage fetal head descent verse repeat cesarean. patient desires trying a few more hours.  Labor: Progressing normally Preeclampsia:  labs stable Fetal Wellbeing:  Category I Pain Control:  Epidural I/D:  n/a Anticipated MOD:  NSVD MAD aware   Melody N Shambley 07/23/2017, 8:10 PM

## 2017-07-23 NOTE — Progress Notes (Signed)
Natasha Myers is a 31 y.o. G2P1001 at [redacted]w[redacted]d by LMP admitted for induction of labor due to Post dates..  Subjective: Rates pain an 5-6 on pain scale, breathing through them well  Objective: BP 104/64   Pulse 80   Temp 98.7 F (37.1 C) (Oral)   Resp 16   Ht 5\' 1"  (1.549 m)   Wt 176 lb (79.8 kg)   LMP 10/12/2016   BMI 33.25 kg/m  No intake/output data recorded. No intake/output data recorded.  FHT:  FHR: 130 bpm, variability: moderate,  accelerations:  Present,  decelerations:  Absent UC:   regular, every 2-3 minutes, mild to palpation, on 15mu/min pitocin SVE:   Dilation: 2 Effacement (%): 50 Station: -3 Exam by:: Maximino Greenland RN  Labs: Lab Results  Component Value Date   WBC 7.5 07/22/2017   HGB 10.3 (L) 07/22/2017   HCT 30.3 (L) 07/22/2017   MCV 89.0 07/22/2017   PLT 238 07/22/2017    Assessment / Plan: Augmentation of labor, progressing well  Labor: Progressing on Pitocin, will continue to increase then AROM Preeclampsia:  labs stable Fetal Wellbeing:  Category I Pain Control:  Labor support without medications I/D:  n/a Anticipated MOD:  NSVD  Melody N Shambley 07/23/2017, 1:27 PM

## 2017-07-24 DIAGNOSIS — Z3A40 40 weeks gestation of pregnancy: Secondary | ICD-10-CM

## 2017-07-24 DIAGNOSIS — O480 Post-term pregnancy: Principal | ICD-10-CM

## 2017-07-24 DIAGNOSIS — O34219 Maternal care for unspecified type scar from previous cesarean delivery: Secondary | ICD-10-CM

## 2017-07-24 LAB — CBC
HEMATOCRIT: 26.3 % — AB (ref 35.0–47.0)
Hemoglobin: 9 g/dL — ABNORMAL LOW (ref 12.0–16.0)
MCH: 30.5 pg (ref 26.0–34.0)
MCHC: 34.1 g/dL (ref 32.0–36.0)
MCV: 89.3 fL (ref 80.0–100.0)
Platelets: 185 10*3/uL (ref 150–440)
RBC: 2.95 MIL/uL — ABNORMAL LOW (ref 3.80–5.20)
RDW: 16.1 % — AB (ref 11.5–14.5)
WBC: 12.2 10*3/uL — AB (ref 3.6–11.0)

## 2017-07-24 LAB — RPR: RPR Ser Ql: NONREACTIVE

## 2017-07-24 MED ORDER — OXYCODONE HCL 5 MG PO TABS
5.0000 mg | ORAL_TABLET | ORAL | Status: DC | PRN
  Administered 2017-07-24 – 2017-07-25 (×3): 5 mg via ORAL
  Filled 2017-07-24 (×3): qty 1

## 2017-07-24 MED ORDER — DIBUCAINE 1 % RE OINT: 1.0000 "application " | TOPICAL_OINTMENT | RECTAL | Status: DC | PRN

## 2017-07-24 MED ORDER — ONDANSETRON 4 MG PO TBDP: 4.0000 mg | ORAL_TABLET | Freq: Four times a day (QID) | ORAL | Status: DC | PRN

## 2017-07-24 MED ORDER — ONDANSETRON HCL 4 MG PO TABS: 4.0000 mg | ORAL_TABLET | ORAL | Status: DC | PRN

## 2017-07-24 MED ORDER — METOCLOPRAMIDE HCL 10 MG PO TABS
10.0000 mg | ORAL_TABLET | Freq: Three times a day (TID) | ORAL | Status: DC
  Administered 2017-07-24 – 2017-07-25 (×6): 10 mg via ORAL
  Filled 2017-07-24 (×6): qty 1

## 2017-07-24 MED ORDER — SENNOSIDES-DOCUSATE SODIUM 8.6-50 MG PO TABS
2.0000 | ORAL_TABLET | ORAL | Status: DC
  Administered 2017-07-25: 2 via ORAL
  Filled 2017-07-24: qty 2

## 2017-07-24 MED ORDER — DIPHENHYDRAMINE HCL 25 MG PO CAPS: 25.0000 mg | ORAL_CAPSULE | Freq: Four times a day (QID) | ORAL | Status: DC | PRN

## 2017-07-24 MED ORDER — SIMETHICONE 80 MG PO CHEW
80.0000 mg | CHEWABLE_TABLET | ORAL | Status: DC | PRN
  Administered 2017-07-25: 80 mg via ORAL
  Filled 2017-07-24: qty 1

## 2017-07-24 MED ORDER — PRENATAL MULTIVITAMIN CH
1.0000 | ORAL_TABLET | Freq: Every day | ORAL | Status: DC
  Administered 2017-07-24 – 2017-07-25 (×2): 1 via ORAL
  Filled 2017-07-24 (×2): qty 1

## 2017-07-24 MED ORDER — OXYCODONE HCL 5 MG PO TABS: 10.0000 mg | ORAL_TABLET | ORAL | Status: DC | PRN

## 2017-07-24 MED ORDER — BENZOCAINE-MENTHOL 20-0.5 % EX AERO
1.0000 "application " | INHALATION_SPRAY | CUTANEOUS | Status: DC | PRN
  Administered 2017-07-24: 1 via TOPICAL
  Filled 2017-07-24: qty 56

## 2017-07-24 MED ORDER — IBUPROFEN 600 MG PO TABS
600.0000 mg | ORAL_TABLET | Freq: Four times a day (QID) | ORAL | Status: DC
  Administered 2017-07-24 – 2017-07-25 (×7): 600 mg via ORAL
  Filled 2017-07-24 (×7): qty 1

## 2017-07-24 MED ORDER — ACETAMINOPHEN 325 MG PO TABS: 650.0000 mg | ORAL_TABLET | ORAL | Status: DC | PRN

## 2017-07-24 MED ORDER — ONDANSETRON HCL 4 MG/2ML IJ SOLN: 4.0000 mg | INTRAMUSCULAR | Status: DC | PRN

## 2017-07-24 MED ORDER — COCONUT OIL OIL
1.0000 "application " | TOPICAL_OIL | Status: DC | PRN
  Administered 2017-07-24: 1 via TOPICAL
  Filled 2017-07-24: qty 120

## 2017-07-24 MED ORDER — WITCH HAZEL-GLYCERIN EX PADS
1.0000 "application " | MEDICATED_PAD | CUTANEOUS | Status: DC | PRN
  Administered 2017-07-24 (×2): 1 via TOPICAL
  Filled 2017-07-24 (×2): qty 100

## 2017-07-24 NOTE — Lactation Note (Signed)
This note was copied from a baby's chart. Lactation Consultation Note  Patient Name: Natasha Myers [id number] Date: 07/24/2017 Reason for consult: Follow-up assessment   Maternal Data Formula Feeding for Exclusion: No Has patient been taught Hand Expression?: Yes Does the patient have breastfeeding experience prior to this delivery?: Yes  Feeding Feeding Type: Breast Fed Length of feed: 0 min (sleepy, not opening mouth, gagging) Baby gagged and spit up mod amt of cl liquid and mucous, not opening mouth LATCH Score Latch: Too sleepy or reluctant, no latch achieved, no sucking elicited.  Audible Swallowing: A few with stimulation  Type of Nipple: Everted at rest and after stimulation  Comfort (Breast/Nipple): Soft / non-tender  Hold (Positioning): Assistance needed to correctly position infant at breast and maintain latch.  LATCH Score: 7  Interventions Interventions: Assisted with latch;Hand express Mom encouraged to attempt again in 30-45 min or before if showing breastfeeding cues Lactation Tools Discussed/Used     Consult Status Consult Status: PRN (attempt again 30-45 min)    Dyann Kief 07/24/2017, 4:51 PM

## 2017-07-25 MED ORDER — VITAMIN D3 125 MCG (5000 UT) PO CAPS: 1.0000 | ORAL_CAPSULE | Freq: Every day | ORAL | 2 refills | Status: DC

## 2017-07-25 MED ORDER — DOCUSATE SODIUM 100 MG PO CAPS: 100.0000 mg | ORAL_CAPSULE | Freq: Every day | ORAL | 2 refills | Status: AC

## 2017-07-25 NOTE — Progress Notes (Signed)
Post Partum Day 1 Subjective: no complaints, up ad lib and voiding  Objective: Blood pressure (!) 103/55, pulse 90, temperature 98 F (36.7 C), temperature source Oral, resp. rate 17, height 5\' 1"  (1.549 m), weight 176 lb (79.8 kg), last menstrual period 10/12/2016, SpO2 99 %, unknown if currently breastfeeding.  Physical Exam:  General: alert, cooperative, appears stated age and fatigued Lochia: appropriate Uterine Fundus: firm DVT Evaluation: No evidence of DVT seen on physical exam. Negative Homan's sign.   Recent Labs  07/22/17 1900 07/24/17 0557  HGB 10.3* 9.0*  HCT 30.3* 26.3*    Assessment/Plan: Plan for discharge tomorrow and Breastfeeding Infant feeding  Both    LOS: 3 days   Natasha Myers N [name] 07/25/2017, 5:11 AM

## 2017-07-25 NOTE — Discharge Summary (Signed)
Obstetric Discharge Summary Reason for Admission: induction of labor Prenatal Procedures: NST and ultrasound Intrapartum Procedures: forceps (low), forceps A.C.head, rotation OP to OA and successful vaginal birth after cesarean Postpartum Procedures: none Complications-Operative and Postpartum: 2nd degree perineal laceration  Delivery Note At 12:14 AM a viable and healthy female was delivered via VBAC, Forcep Assisted (Presentation:OA ;  ).  APGAR: 7, 9; weight 7 lb 12.2 oz (3520 g).    Anesthesia:  epidural Episiotomy: None Lacerations: 2nd degree Suture Repair: 3.0 vicryl rapide Est. Blood Loss (mL): 300  Mom to postpartum.  Baby to Couplet care / Skin to Skin.  Natasha Myers Natasha Myers 07/25/2017, 6:06 PM    .     H/H:  Lab Results  Component Value Date/Time   HGB 9.0 (L) 07/24/2017 05:57 AM   HGB 10.8 (L) 04/22/2017 08:22 AM   HCT 26.3 (L) 07/24/2017 05:57 AM   HCT 31.8 (L) 04/22/2017 08:22 AM    Discharge Diagnoses: Term Pregnancy-delivered and Successful VBAC  Discharge Information: Date: 07/25/2017 Activity: pelvic rest Diet: routine Baby feeding: plans to breastfeed Contraception: no method Medications: PNV, Ibuprofen and Colace Condition: stable Instructions: refer to practice specific booklet Discharge to: home   Natasha Myers [place], [place] 07/25/2017,6:06 PM

## 2017-07-25 NOTE — Progress Notes (Signed)
Discharge to home.  To car via auxillary 

## 2017-07-25 NOTE — Progress Notes (Signed)
Dc inst reviewed with pt.  Verb u/o 

## 2017-07-26 ENCOUNTER — Other Ambulatory Visit: Payer: Self-pay | Admitting: *Deleted

## 2017-07-26 MED ORDER — FUSION PLUS PO CAPS: 1.0000 | ORAL_CAPSULE | Freq: Every day | ORAL | 2 refills | Status: DC

## 2017-07-29 NOTE — Anesthesia Postprocedure Evaluation (Signed)
Anesthesia Post Note  Patient: Natasha Myers  Procedure(s) Performed: * No procedures listed *  Patient location during evaluation: Other Anesthesia Type: Epidural Level of consciousness: awake and alert Pain management: pain level controlled Vital Signs Assessment: post-procedure vital signs reviewed and stable Respiratory status: spontaneous breathing, nonlabored ventilation and respiratory function stable Cardiovascular status: stable Postop Assessment: no headache, no backache and epidural receding Anesthetic complications: no Comments: Contents for note per chart.  Pt. Discharged prior to evaluations. JA     Last Vitals: There were no vitals filed for this visit.  Last Pain: There were no vitals filed for this visit.               Yevette Edwards

## 2017-08-16 NOTE — Telephone Encounter (Signed)
Called patient with date and time of scheduled induction of labor, verbalized understanding.   Reviewed red flag symptoms and when to call.    Gunnar BullaJenkins Michelle Randel Hargens, CNM

## 2017-08-25 ENCOUNTER — Encounter: Payer: Self-pay | Admitting: Certified Nurse Midwife

## 2017-08-25 ENCOUNTER — Ambulatory Visit (INDEPENDENT_AMBULATORY_CARE_PROVIDER_SITE_OTHER): Payer: Medicaid Other | Admitting: Certified Nurse Midwife

## 2017-08-25 VITALS — BP 111/67 | HR 76 | Wt 159.2 lb

## 2017-08-25 DIAGNOSIS — N939 Abnormal uterine and vaginal bleeding, unspecified: Secondary | ICD-10-CM

## 2017-08-25 NOTE — Patient Instructions (Signed)
Preventive Care 18-39 Years, Female Preventive care refers to lifestyle choices and visits with your health care provider that can promote health and wellness. What does preventive care include?  A yearly physical exam. This is also called an annual well check.  Dental exams once or twice a year.  Routine eye exams. Ask your health care provider how often you should have your eyes checked.  Personal lifestyle choices, including: ? Daily care of your teeth and gums. ? Regular physical activity. ? Eating a healthy diet. ? Avoiding tobacco and drug use. ? Limiting alcohol use. ? Practicing safe sex. ? Taking vitamin and mineral supplements as recommended by your health care provider. What happens during an annual well check? The services and screenings done by your health care provider during your annual well check will depend on your age, overall health, lifestyle risk factors, and family history of disease. Counseling Your health care provider may ask you questions about your:  Alcohol use.  Tobacco use.  Drug use.  Emotional well-being.  Home and relationship well-being.  Sexual activity.  Eating habits.  Work and work Statistician.  Method of birth control.  Menstrual cycle.  Pregnancy history.  Screening You may have the following tests or measurements:  Height, weight, and BMI.  Diabetes screening. This is done by checking your blood sugar (glucose) after you have not eaten for a while (fasting).  Blood pressure.  Lipid and cholesterol levels. These may be checked every 5 years starting at age 66.  Skin check.  Hepatitis C blood test.  Hepatitis B blood test.  Sexually transmitted disease (STD) testing.  BRCA-related cancer screening. This may be done if you have a family history of breast, ovarian, tubal, or peritoneal cancers.  Pelvic exam and Pap test. This may be done every 3 years starting at age 40. Starting at age 59, this may be done every 5  years if you have a Pap test in combination with an HPV test.  Discuss your test results, treatment options, and if necessary, the need for more tests with your health care provider. Vaccines Your health care provider may recommend certain vaccines, such as:  Influenza vaccine. This is recommended every year.  Tetanus, diphtheria, and acellular pertussis (Tdap, Td) vaccine. You may need a Td booster every 10 years.  Varicella vaccine. You may need this if you have not been vaccinated.  HPV vaccine. If you are 69 or younger, you may need three doses over 6 months.  Measles, mumps, and rubella (MMR) vaccine. You may need at least one dose of MMR. You may also need a second dose.  Pneumococcal 13-valent conjugate (PCV13) vaccine. You may need this if you have certain conditions and were not previously vaccinated.  Pneumococcal polysaccharide (PPSV23) vaccine. You may need one or two doses if you smoke cigarettes or if you have certain conditions.  Meningococcal vaccine. One dose is recommended if you are age 27-21 years and a first-year college student living in a residence hall, or if you have one of several medical conditions. You may also need additional booster doses.  Hepatitis A vaccine. You may need this if you have certain conditions or if you travel or work in places where you may be exposed to hepatitis A.  Hepatitis B vaccine. You may need this if you have certain conditions or if you travel or work in places where you may be exposed to hepatitis B.  Haemophilus influenzae type b (Hib) vaccine. You may need this if  you have certain risk factors.  Talk to your health care provider about which screenings and vaccines you need and how often you need them. This information is not intended to replace advice given to you by your health care provider. Make sure you discuss any questions you have with your health care provider. Document Released: 01/19/2002 Document Revised: 08/12/2016  Document Reviewed: 09/24/2015 Elsevier Interactive Patient Education  2017 [employer].

## 2017-08-25 NOTE — Progress Notes (Signed)
GYN ENCOUNTER NOTE  Subjective:       Natasha Myers is a 31 y.o. G50P2002 female is here for gynecologic evaluation of the following issues:  1. Vaginal bleeding. She is 4 wks status post forceps assisted vaginal delivery. She states that her bleeding decreased post delivery as expected and  she stopped bleeding approximately a week ago but started bleeding again on Sunday. She is not breast feeding. The bleeding is slightly heavier than she used to experience on her cycle prior to pregnancy.    Gynecologic History No LMP recorded. Contraception: none Last Pap: .01/04/17 Results were: abnormal Lisl- HPV high risk present  Last mammogram: N/A.   Obstetric History OB History  Gravida Para Term Preterm AB Living  SAB TAB Ectopic Multiple Live Births        0 2    # Outcome Date GA Lbr Len/2nd Weight Sex Delivery Anes PTL Lv  2 Term 07/24/17 [redacted]w[redacted]d / 02:55 7 lb 12.2 oz (3.52 kg) M VBAC, Forcep EPI  LIV  1 Term 04/19/07 [redacted]w[redacted]d  9 lb 6.4 oz (4.264 kg) M CS-Unspec  N LIV    Obstetric Comments  Arrest of descent at 2nd stage (failed vacuum).     Past Medical History:  Diagnosis Date  . GERD (gastroesophageal reflux disease)   . History of abnormal cervical Pap smear     Past Surgical History:  Procedure Laterality Date  . CESAREAN SECTION    . CHOLECYSTECTOMY    . COLPOSCOPY      Current Outpatient Prescriptions on File Prior to Visit  Medication Sig Dispense Refill  . Cholecalciferol (VITAMIN D3) 5000 units CAPS Take 1 capsule (5,000 Units total) by mouth daily. 120 capsule 2  . docusate sodium (COLACE) 100 MG capsule Take 1 capsule (100 mg total) by mouth daily. 120 capsule 2  . fluticasone (FLONASE) 50 MCG/ACT nasal spray Place into the nose.    . Iron-FA-B Cmp-C-Biot-Probiotic (FUSION PLUS) CAPS Take 1 capsule by mouth daily. 30 capsule 2  . Prenatal Vit-Fe Fumarate-FA (MULTIVITAMIN-PRENATAL) 27-0.8 MG TABS tablet Take 1 tablet by mouth daily at 12 noon.    .  metoCLOPramide (REGLAN) 10 MG tablet Take 1 tablet (10 mg total) by mouth 3 (three) times daily before meals. (Patient not taking: Reported on 08/25/2017) 30 tablet 2  . ondansetron (ZOFRAN ODT) 4 MG disintegrating tablet Take 1 tablet (4 mg total) by mouth every 6 (six) hours as needed for nausea. (Patient not taking: Reported on 08/25/2017) 20 tablet 0  . pantoprazole (PROTONIX) 20 MG tablet Take 1 tablet (20 mg total) by mouth 2 (two) times daily. (Patient not taking: Reported on 08/25/2017) 60 tablet 4   No current facility-administered medications on file prior to visit.     No Known Allergies  Social History   Social History  . Marital status: Single    Spouse name: N/A  . Number of children: N/A  . Years of education: N/A   Occupational History  . Not on file.   Social History Main Topics  . Smoking status: Never Smoker  . Smokeless tobacco: Never Used  . Alcohol use No  . Drug use: No  . Sexual activity: Yes    Partners: Male    Birth control/ protection: None   Other Topics Concern  . Not on file   Social History Narrative  . No narrative on file    Family History  Problem Relation  Age of Onset  . Diabetes Other        paternal side  . Hypertension Mother   . Heart failure Father   . Rheum arthritis Father   . Hypertension Maternal Grandfather   . Cancer Paternal Grandmother        pancreatic  . Diabetes Paternal Grandmother   . Diabetes Paternal Grandfather   . Heart failure Paternal Grandfather   . Stroke Maternal Aunt   . Thyroid disease Maternal Aunt     The following portions of the patient's history were reviewed and updated as appropriate: allergies, current medications, past family history, past medical history, past social history, past surgical history and problem list.  Review of Systems Review of Systems - Negative except as mentioned in hpi Review of Systems - General ROS: negative for - chills, fatigue, fever, hot flashes, malaise or night  sweats Hematological and Lymphatic ROS: negative for - bleeding problems or swollen lymph nodes Gastrointestinal ROS: negative for - abdominal pain, blood in stools, change in bowel habits and nausea/vomiting Musculoskeletal ROS: negative for - joint pain, muscle pain or muscular weakness Genito-Urinary ROS: negative for - change in menstrual cycle, dysmenorrhea, dyspareunia, dysuria, genital discharge, genital ulcers, hematuria, incontinence, irregular/heavy menses, nocturia or pelvic pain  Objective:   BP 111/67   Pulse 76   Wt 159 lb 4 oz (72.2 kg)   Breastfeeding? No   BMI 30.09 kg/m  CONSTITUTIONAL: Well-developed, well-nourished female in no acute distress.  HENT:  Normocephalic, atraumatic.  NECK: Normal range of motion, supple, no masses.  Normal thyroid.  SKIN: Skin is warm and dry. No rash noted. Not diaphoretic. No erythema. No pallor. NEUROLGIC: Alert and oriented to person, place, and time.  PSYCHIATRIC: Normal mood and affect. Normal behavior. Normal judgment and thought content. CARDIOVASCULAR:Not Examined RESPIRATORY: Not Examined BREASTS: Not Examined ABDOMEN: Soft, non distended; Non tender.  No Organomegaly. PELVIC: Deferred until ppv MUSCULOSKELETAL: Normal range of motion. No tenderness.  No cyanosis, clubbing, or edema.   Assessment:   1. Abnormal vaginal bleeding    Plan:   Discuss  return of menses bing irregular and heavy for some women after having a baby. She is not currently breastfeeding. She denies signs and symptoms of infection. Discussed use of birth control to help regulate cycles. She is undecided on birth control at this time. Options reviewed. She denies feeling lightheaded and states that her cycle normally last a week Auletta. She will follow up in 2 wks for her PPV or sooner if needed.   I attest more than 50% of this visit was spent reviewing pt history, discussing menses after birth and discussing treatment options.   Doreene Burke, CNM

## 2017-08-25 NOTE — Progress Notes (Signed)
Pt is 4 weeks and 4 days post partum and is c/o very  heavy flow with clots. States the flow was almost gone last week and started again.

## 2017-09-10 ENCOUNTER — Encounter: Payer: Self-pay | Admitting: Obstetrics and Gynecology

## 2017-09-10 ENCOUNTER — Ambulatory Visit (INDEPENDENT_AMBULATORY_CARE_PROVIDER_SITE_OTHER): Payer: Medicaid Other | Admitting: Obstetrics and Gynecology

## 2017-09-10 NOTE — Progress Notes (Signed)
   Subjective:     Natasha Myers is a 31 y.o. female who presents for a postpartum visit. She is 6 weeks postpartum following a low forceps vaginal delivery. I have fully reviewed the prenatal and intrapartum course. The delivery was at 39 gestational weeks. Outcome: vaginal birth after cesarean (VBAC) and forceps, low. Anesthesia: epidural. Postpartum course has been uncomplicated. Baby's course has been uncomplicated. Baby is feeding by bottle - Similac Advance. Bleeding no bleeding. Bowel function is normal. Bladder function is normal. Patient is not sexually active. Contraception method is none. Postpartum depression screening: negative. Depression screen PHQ 2/9 09/10/2017  Decreased Interest 2  Down, Depressed, Hopeless 1  PHQ - 2 Score 3  Altered sleeping 2  Tired, decreased energy 0  Change in appetite 1  Feeling bad or failure about yourself  0  Trouble concentrating 0  Moving slowly or fidgety/restless 0  Suicidal thoughts 0  PHQ-9 Score 6  Difficult doing work/chores Somewhat difficult    The following portions of the patient's history were reviewed and updated as appropriate: allergies, current medications, past family history, past medical history, past social history, past surgical history and problem list.  Review of Systems A comprehensive review of systems was negative.   Objective:    BP 114/70   Pulse 65   Ht  (1.549 m)   Wt 155 lb 11.2 oz (70.6 kg)   LMP 08/26/2017   Breastfeeding? No   BMI 29.42 kg/m   General:  alert, cooperative and appears stated age   Breasts:  inspection negative, no nipple discharge or bleeding, no masses or nodularity palpable  Lungs: clear to auscultation bilaterally  Heart:  regular rate and rhythm, S1, S2 normal, no murmur, click, rub or gallop  Abdomen: soft, non-tender; bowel sounds normal; no masses,  no organomegaly   Vulva:  normal  Vagina: normal vagina  Cervix:  multiparous appearance  Corpus: normal size, contour,  position, consistency, mobility, non-tender  Adnexa:  no mass, fullness, tenderness  Rectal Exam: Not performed.        Assessment:     6 weeks postpartum exam. Pap smear not done at today's visit.   Plan:    1. Contraception: condoms 2. Labs obtained -will follow up accordingly 3. Follow up in: 3 months or as needed.

## 2017-09-10 NOTE — Patient Instructions (Signed)
  Place postpartum visit patient instructions here.  

## 2017-09-11 LAB — CBC
HEMATOCRIT: 32.6 % — AB (ref 34.0–46.6)
Hemoglobin: 10.6 g/dL — ABNORMAL LOW (ref 11.1–15.9)
MCH: 29.4 pg (ref 26.6–33.0)
MCHC: 32.5 g/dL (ref 31.5–35.7)
MCV: 90 fL (ref 79–97)
Platelets: 329 10*3/uL (ref 150–379)
RBC: 3.61 x10E6/uL — ABNORMAL LOW (ref 3.77–5.28)
RDW: 15.6 % — AB (ref 12.3–15.4)
WBC: 3.9 10*3/uL (ref 3.4–10.8)

## 2017-09-11 LAB — VITAMIN D 25 HYDROXY (VIT D DEFICIENCY, FRACTURES): Vit D, 25-Hydroxy: 42.9 ng/mL (ref 30.0–100.0)

## 2017-09-11 LAB — FERRITIN: Ferritin: 19 ng/mL (ref 15–150)

## 2017-09-14 ENCOUNTER — Other Ambulatory Visit: Payer: Self-pay | Admitting: Obstetrics and Gynecology

## 2017-09-14 DIAGNOSIS — D649 Anemia, unspecified: Secondary | ICD-10-CM

## 2017-09-14 MED ORDER — FUSION PLUS PO CAPS: 1.0000 | ORAL_CAPSULE | Freq: Every day | ORAL | 2 refills | Status: DC

## 2017-10-11 IMAGING — US US MFM OB DETAIL+14 WK
1 series · 12 of 28 positions shown · non-contrast
Comparison: none

pm)
PATIENT INFO:

PERFORMED BY:
SERVICE(S) PROVIDED:
INDICATIONS:
25 weeks gestation of pregnancy
FETAL EVALUATION:
Num Of Fetuses:     1
Fetal Heart         137
Rate(bpm):
Presentation:       Cephalic
Placenta:           Posterior
BIOMETRY:
BPD:      63.2  mm     G. Age:  25w 4d         47  %    CI:        73.24   %    70 - 86
FL/HC:       20.7  %    18.7 -
HC:      234.7  mm     G. Age:  25w 3d         31  %    HC/AC:       1.16       1.04 -
AC:      202.9  mm     G. Age:  24w 6d         25  %    FL/BPD:      76.9  %    71 - 87
FL:       48.6  mm     G. Age:  26w 2d         64  %    FL/AC:       24.0  %    20 - 24
HUM:      44.6  mm     G. Age:  26w 3d         70  %
CER:        25  mm     G. Age:  23w 0d        < 5  %
CM:        3.9  mm
Est. FW:     819   gm   1 lb 13 oz      44  %
GESTATIONAL AGE:
LMP:           25w 3d        Date:  10/12/16                 EDD:   07/19/17
U/S Today:     25w 4d                                        EDD:   07/18/17
Best:          25w 3d     Det. By:  LMP  (10/12/16)          EDD:   07/19/17
ANATOMY:
Cavum:                 CSP visualized         Aortic Arch:            Normal appearance
Ventricles:            Normal appearance      Ductal Arch:            Normal appearance
Choroid Plexus:        Within Normal Limits   Diaphragm:              Within Normal Limits
Cerebellum:            Within Normal Limits   Stomach:                Seen
Posterior Fossa:       Within Normal Limits   Abdomen:                Within Normal
Limits
Nuchal Fold:           Within Normal Limits   Abdominal Wall:         Normal appearance
Face:                  Orbits visualized      Cord Vessels:           3 vessels
Lips:                  Normal appearance      Kidneys:                Normal appearance
Thoracic:              Within Normal Limits   Bladder:                Seen
Heart:                 4-Chamber view         Spine:                  Normal appearance
appears normal
RVOT:                  Normal appearance      Upper Extremities:      Visualized
LVOT:                  Normal appearance      Lower Extremities:      Visualized
CERVIX UTERUS ADNEXA:
Cervix
Length:            3.6  cm.

[Series 1: us mfm ob detail+14 wk · 0.23mm/px · 12 of 59 slices shown]
[im 3/59]
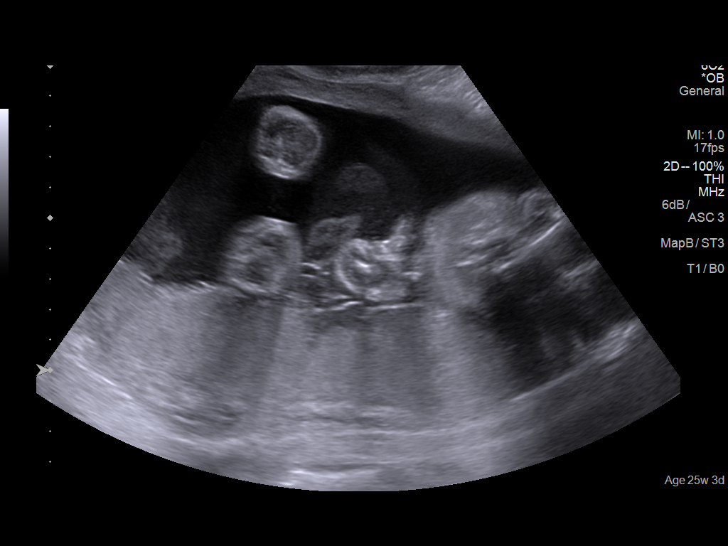
[im 7/59]
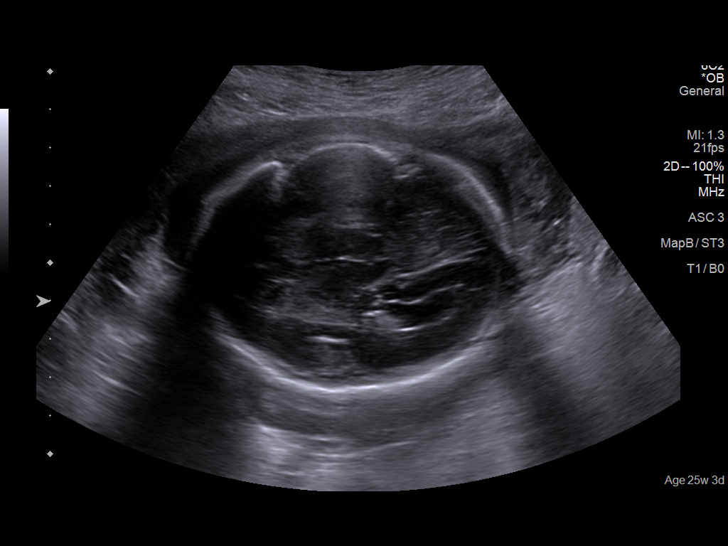
[im 11/59]
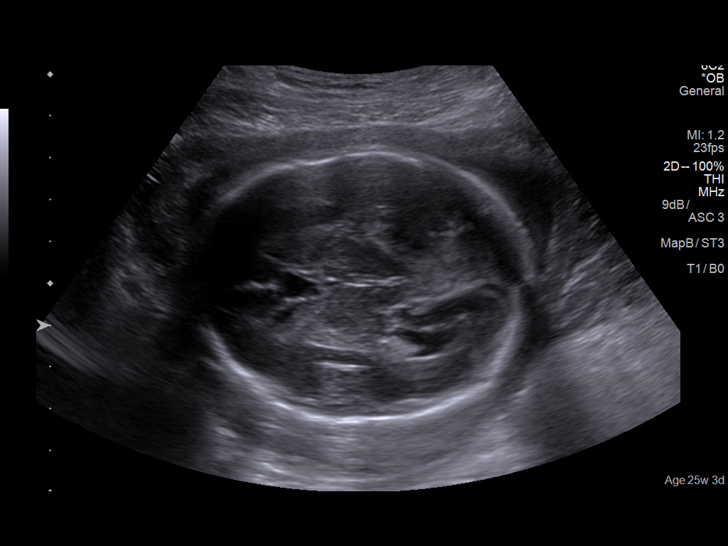
[im 18/59]
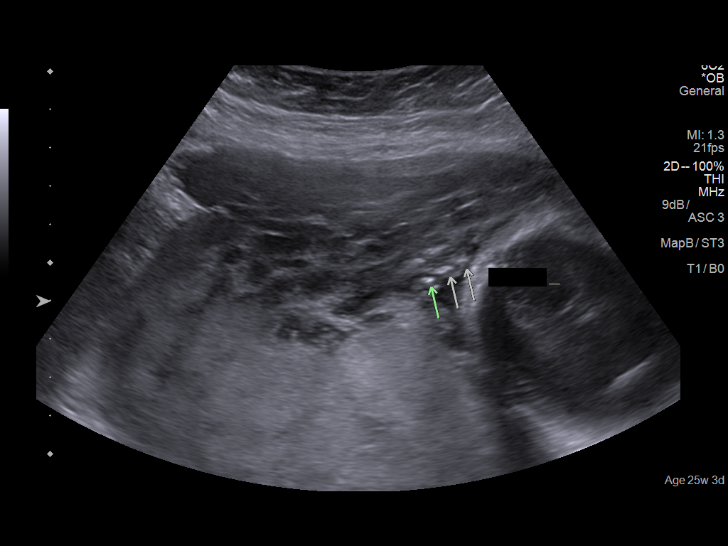
[im 22/59]
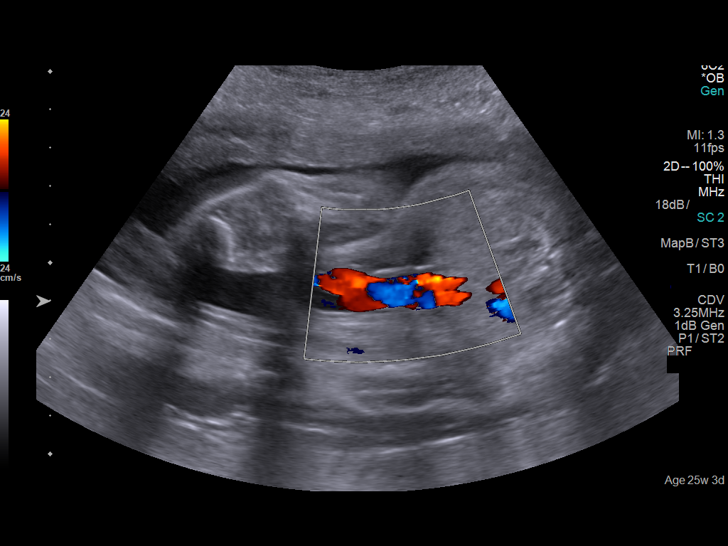
[im 26/59]
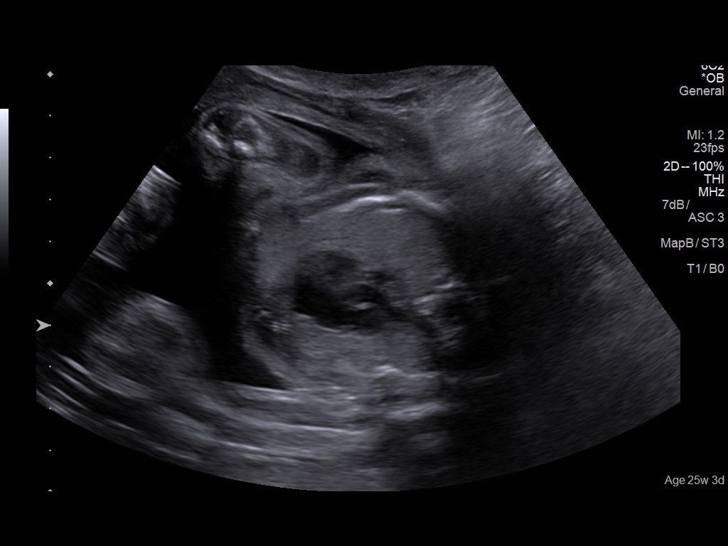
[im 33/59]
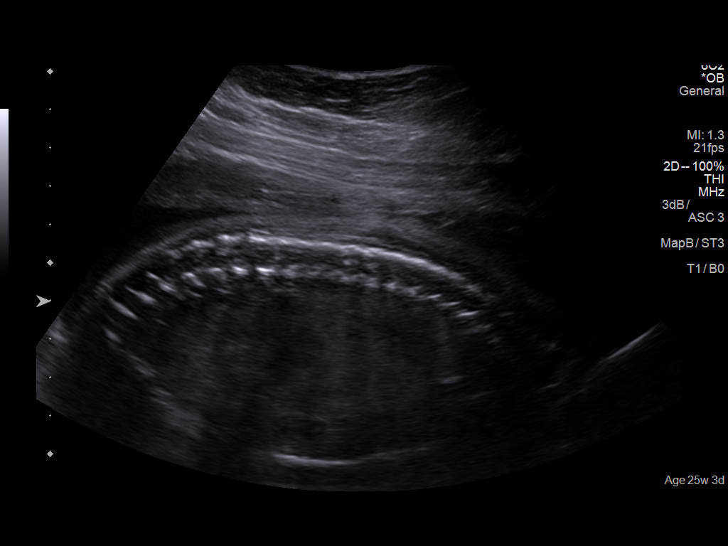
[im 37/59]
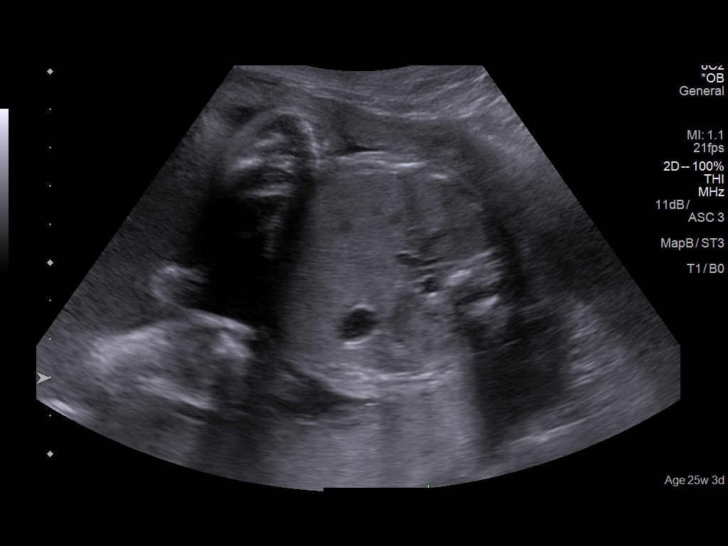
[im 41/59]
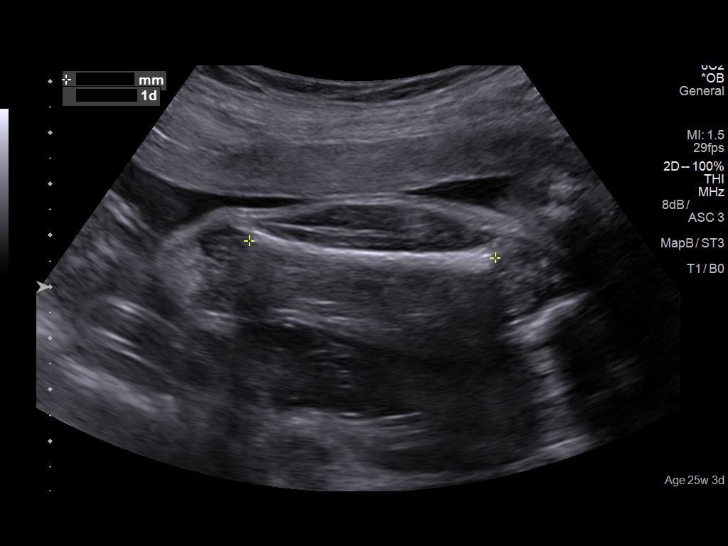
[im 48/59]
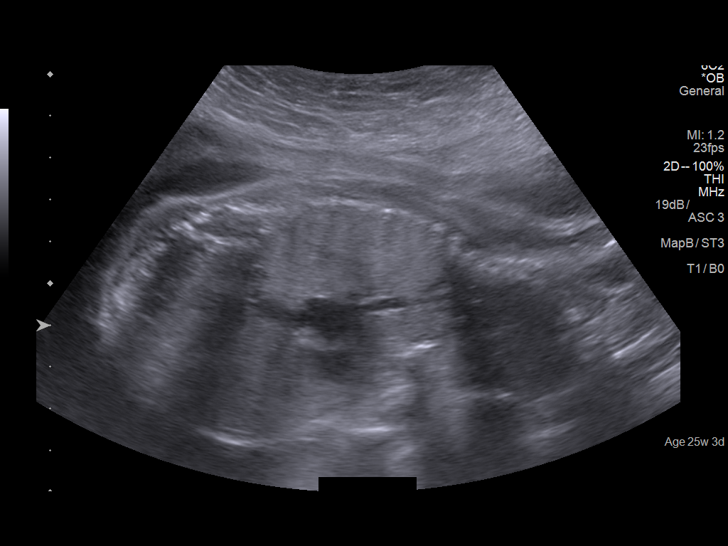
[im 52/59]
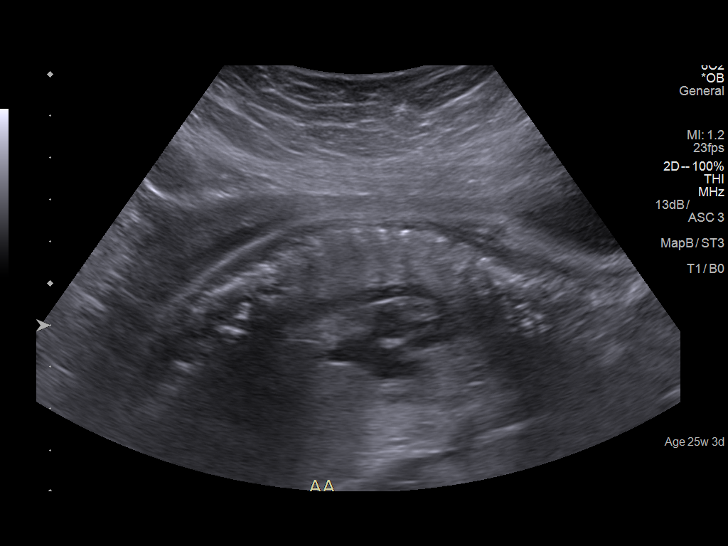
[im 56/59]
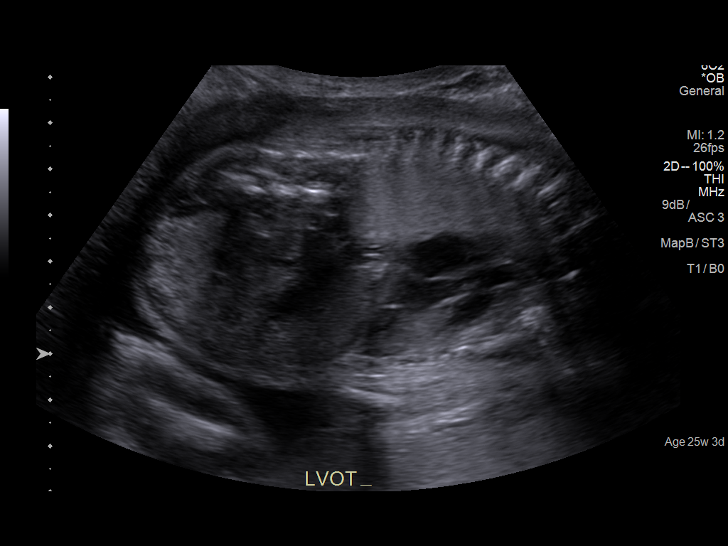

[12 of 28 positions shown; findings below may reference images not displayed]

IMPRESSION: Dear Dr.   Katsuro,

Thank you for referring your patient for detailed anatomic
survey due to intracardiac echogenic focus seen on outside
ultrasound.  She had normal first trimester screen results
demonstrating a risk of 1 in 10,000 for Trisomies 21, 13, and
18.  Dating is by ultrasound performed at [facility] on
12/21/16; measurements were consistent with 10 weeks 0
days.

There is a singleton gestation with subjectively normal
amniotic fluid volume.

The fetal biometry correlates with established dating.

The detailed fetal anatomic survey is unremarkable.  The
area of suspected echogenic focus is seen but is not as
bright in appearance as bone.  The amniotic fluid volume is
normal and active fetal movements were seen.  These
findings were reviewed with the patient.

Thank you for allowing us to participate in your patient's care.

assistance.

## 2017-12-17 ENCOUNTER — Encounter: Payer: Medicaid Other | Admitting: Obstetrics and Gynecology

## 2018-11-13 ENCOUNTER — Emergency Department
Admission: EM | Admit: 2018-11-13 | Discharge: 2018-11-13 | Disposition: A | Discharge status: Home / Self Care | Payer: Managed Care, Other (non HMO) | Attending: Emergency Medicine | Admitting: Emergency Medicine

## 2018-11-13 ENCOUNTER — Other Ambulatory Visit: Payer: Self-pay

## 2018-11-13 DIAGNOSIS — R51 Headache: Secondary | ICD-10-CM | POA: Diagnosis not present

## 2018-11-13 DIAGNOSIS — R0981 Nasal congestion: Secondary | ICD-10-CM | POA: Insufficient documentation

## 2018-11-13 DIAGNOSIS — R69 Illness, unspecified: Secondary | ICD-10-CM

## 2018-11-13 DIAGNOSIS — J111 Influenza due to unidentified influenza virus with other respiratory manifestations: Secondary | ICD-10-CM | POA: Diagnosis not present

## 2018-11-13 DIAGNOSIS — M7918 Myalgia, other site: Secondary | ICD-10-CM | POA: Diagnosis present

## 2018-11-13 DIAGNOSIS — Z79899 Other long term (current) drug therapy: Secondary | ICD-10-CM | POA: Diagnosis not present

## 2018-11-13 LAB — INFLUENZA PANEL BY PCR (TYPE A & B)
INFLAPCR: NEGATIVE
INFLBPCR: NEGATIVE

## 2018-11-13 NOTE — ED Triage Notes (Signed)
Pt states generalized body aches, cough, nasal congestion. Pt states began yesterday. Pt masked in triage. Pt complains of back pain, denies dysuria.

## 2018-11-13 NOTE — ED Notes (Signed)
Patient given a warm blanket. Patient also given an update on wait time. Patient verbalizes understanding.

## 2018-11-13 NOTE — ED Notes (Signed)
Patient resting with eyes closed in no acute distress at this time.  

## 2018-11-13 NOTE — Discharge Instructions (Signed)

## 2018-11-13 NOTE — ED Provider Notes (Signed)
[facility] Emergency Department Provider Note  ____________________________________________   First MD Initiated Contact with Patient 11/13/18 [phone]     (approximate)  I have reviewed the triage vital signs and the nursing notes.   HISTORY  Chief Complaint Generalized Body Aches and Nasal Congestion    HPI Natasha Myers is a 32 y.o. female with no contributory past medical history who presents for evaluation of 1 to 2 days of generalized body aches, nasal congestion, runny nose, mild headache, and pain with deep inspiration.  She is not having any significant cough or sputum production.  Nothing in particular makes her symptoms better and she has been trying Sudafed, ibuprofen, and Tylenol.  She felt she should get checked out because she was afraid she had the flu.  She denies shortness of breath, nausea, vomiting, abdominal pain, and dysuria.  Her chest only hurts when she takes deep breaths or moves in a certain way.  She says that her body does ache.  She has no history of blood clots in the legs of the lungs.  She does not use exogenous estrogen, no recent immobilizations or surgeries, no recent unilateral leg pain or swelling.  Past Medical History:  Diagnosis Date  . GERD (gastroesophageal reflux disease)   . History of abnormal cervical Pap smear     Patient Active Problem List   Diagnosis Date Noted  . Anemia 09/14/2017    Past Surgical History:  Procedure Laterality Date  . CESAREAN SECTION    . CHOLECYSTECTOMY    . COLPOSCOPY      Prior to Admission medications   Medication Sig Start Date End Date Taking? Authorizing Provider  Cholecalciferol (VITAMIN D3) 5000 units CAPS Take 1 capsule (5,000 Units total) by mouth daily. 07/25/17   Shambley, Melody N, CNM  fluticasone (FLONASE) 50 MCG/ACT nasal spray Place into the nose. 02/04/16   [provider]  Iron-FA-B Cmp-C-Biot-Probiotic (FUSION PLUS) CAPS Take 1 capsule by mouth daily.  09/14/17   Shambley, Melody N, CNM  Prenatal Vit-Fe Fumarate-FA (MULTIVITAMIN-PRENATAL) 27-0.8 MG TABS tablet Take 1 tablet by mouth daily at 12 noon.    [provider]    Allergies Patient has no known allergies.  Family History  Problem Relation Age of Onset  . Diabetes Other        paternal side  . Hypertension Mother   . Heart failure Father   . Rheum arthritis Father   . Hypertension Maternal Grandfather   . Cancer Paternal Grandmother        pancreatic  . Diabetes Paternal Grandmother   . Diabetes Paternal Grandfather   . Heart failure Paternal Grandfather   . Stroke Maternal Aunt   . Thyroid disease Maternal Aunt     Social History Social History   Tobacco Use  . Smoking status: Never Smoker  . Smokeless tobacco: Never Used  Substance Use Topics  . Alcohol use: No  . Drug use: No    Review of Systems Constitutional: Myalgias but no fever or chills of which she is aware Eyes: No visual changes. ENT: No sore throat.  Nasal congestion and runny nose.  No earache. Cardiovascular: Some pain in her chest with deep inspiration. Respiratory: Denies shortness of breath. Gastrointestinal: No abdominal pain.  No nausea, no vomiting.  No diarrhea.  No constipation. Genitourinary: Negative for dysuria. Musculoskeletal: Negative for neck pain.  Negative for back pain. Integumentary: Negative for rash. Neurological: Negative for headaches, focal weakness or numbness.  ____________________________________________   PHYSICAL EXAM:  VITAL SIGNS: ED Triage Vitals [11/13/18 0123]  Enc Vitals Group     BP 116/83     Pulse Rate 82     Resp 16     Temp 98.9 F (37.2 C)     Temp Source Oral     SpO2 100 %     Weight 80.3 kg (177 lb)     Height 1.549 m (5\' 1" )     Head Circumference      Peak Flow      Pain Score 6     Pain Loc      Pain Edu?      Excl. in GC?     Constitutional: Alert and oriented.  Appears uncomfortable from viral illness but is not  in acute distress. Eyes: Conjunctivae are normal.  Head: Atraumatic. Nose: +congestion/rhinnorhea. Mouth/Throat: Mucous membranes are moist. Neck: No stridor.  No meningeal signs including with full flexion and extension of her head and neck and rotation side to side. Cardiovascular: Normal rate, regular rhythm. Good peripheral circulation. Grossly normal heart sounds. Respiratory: Normal respiratory effort.  No retractions. Lungs CTAB. Gastrointestinal: Soft and nontender. No distention.  Musculoskeletal: No lower extremity tenderness nor edema. No gross deformities of extremities. Neurologic:  Normal speech and language. No gross focal neurologic deficits are appreciated.  Skin:  Skin is warm, dry and intact. No rash noted. Psychiatric: Mood and affect are normal. Speech and behavior are normal.  ____________________________________________   LABS (all labs ordered are listed, but only abnormal results are displayed)  Labs Reviewed  INFLUENZA PANEL BY PCR (TYPE A & B)   ____________________________________________  EKG  No indication for EKG ____________________________________________  RADIOLOGY   ED MD interpretation: No indication for imaging  Official radiology report(s): No results found.  ____________________________________________   PROCEDURES  Critical Care performed: No   Procedure(s) performed:   Procedures   ____________________________________________   INITIAL IMPRESSION / ASSESSMENT AND PLAN / ED COURSE  As part of my medical decision making, I reviewed the following data within the electronic MEDICAL RECORD NUMBER Nursing notes reviewed and incorporated, Labs reviewed , Old chart reviewed and Notes from prior ED visits    Differential diagnosis includes, but is not limited to, viral illness including but not limited to influenza, pneumonia, less likely pulmonary embolism.  The patient is in no respiratory distress and has normal vital signs and  is PERC negative.  Her influenza study was negative.  There is no indication for chest x-ray although I did offer her 1, but she declines and she is not having any shortness of breath or wheezing and she has clear lungs.  She has some reproducible tenderness to palpation of her chest wall and the left side of her back.  We discussed influenza-like illness and I gave my usual and customary recommendations and return precautions.  She is going to follow-up as an outpatient and will return with worsening symptoms.  No indication for further work-up at this time.     ____________________________________________  FINAL CLINICAL IMPRESSION(S) / ED DIAGNOSES  Final diagnoses:  Influenza-like illness     MEDICATIONS GIVEN DURING THIS VISIT:  Medications - No data to display   ED Discharge Orders    None       Note:  This document was prepared using Dragon voice recognition software and may include unintentional dictation errors.    Loleta RoseForbach, Jakaleb Payer, MD 11/13/18 [id number]

## 2019-03-24 ENCOUNTER — Encounter: Payer: Medicaid Other | Admitting: Obstetrics and Gynecology

## 2019-05-26 ENCOUNTER — Encounter: Payer: Medicaid Other | Admitting: Obstetrics and Gynecology

## 2019-08-01 ENCOUNTER — Encounter: Payer: Medicaid Other | Admitting: Obstetrics and Gynecology

## 2020-01-14 ENCOUNTER — Encounter: Payer: Self-pay | Admitting: Emergency Medicine

## 2020-01-14 ENCOUNTER — Other Ambulatory Visit: Payer: Self-pay

## 2020-01-14 ENCOUNTER — Emergency Department
Admission: EM | Admit: 2020-01-14 | Discharge: 2020-01-14 | Disposition: A | Discharge status: Home / Self Care | Payer: Managed Care, Other (non HMO) | Attending: Emergency Medicine | Admitting: Emergency Medicine

## 2020-01-14 DIAGNOSIS — R1013 Epigastric pain: Secondary | ICD-10-CM | POA: Diagnosis present

## 2020-01-14 DIAGNOSIS — R112 Nausea with vomiting, unspecified: Secondary | ICD-10-CM | POA: Insufficient documentation

## 2020-01-14 DIAGNOSIS — Z79899 Other long term (current) drug therapy: Secondary | ICD-10-CM | POA: Insufficient documentation

## 2020-01-14 LAB — CBC
HCT: 34.4 % — ABNORMAL LOW (ref 36.0–46.0)
Hemoglobin: 11.7 g/dL — ABNORMAL LOW (ref 12.0–15.0)
MCH: 31.8 pg (ref 26.0–34.0)
MCHC: 34 g/dL (ref 30.0–36.0)
MCV: 93.5 fL (ref 80.0–100.0)
Platelets: 259 10*3/uL (ref 150–400)
RBC: 3.68 MIL/uL — ABNORMAL LOW (ref 3.87–5.11)
RDW: 12.1 % (ref 11.5–15.5)
WBC: 5.5 10*3/uL (ref 4.0–10.5)
nRBC: 0 % (ref 0.0–0.2)

## 2020-01-14 LAB — COMPREHENSIVE METABOLIC PANEL
ALT: 19 U/L (ref 0–44)
AST: 16 U/L (ref 15–41)
Albumin: 4 g/dL (ref 3.5–5.0)
Alkaline Phosphatase: 64 U/L (ref 38–126)
Anion gap: 8 (ref 5–15)
BUN: 11 mg/dL (ref 6–20)
CO2: 25 mmol/L (ref 22–32)
Calcium: 9 mg/dL (ref 8.9–10.3)
Chloride: 105 mmol/L (ref 98–111)
Creatinine, Ser: 0.61 mg/dL (ref 0.44–1.00)
GFR calc Af Amer: >60 mL/min (ref >60)
GFR calc non Af Amer: >60 mL/min (ref >60)
Glucose, Bld: 96 mg/dL (ref 70–99)
Potassium: 4 mmol/L (ref 3.5–5.1)
Sodium: 138 mmol/L (ref 135–145)
Total Bilirubin: 0.5 mg/dL (ref 0.3–1.2)
Total Protein: 7.1 g/dL (ref 6.5–8.1)

## 2020-01-14 LAB — URINALYSIS, COMPLETE (UACMP) WITH MICROSCOPIC
Bacteria, UA: NONE SEEN
Bilirubin Urine: NEGATIVE
Glucose, UA: NEGATIVE mg/dL
Hgb urine dipstick: NEGATIVE
Ketones, ur: NEGATIVE mg/dL
Leukocytes,Ua: NEGATIVE
Nitrite: NEGATIVE
Protein, ur: 30 mg/dL — AB
Specific Gravity, Urine: 1.029 (ref 1.005–1.030)
pH: 5 (ref 5.0–8.0)

## 2020-01-14 LAB — POCT PREGNANCY, URINE: Preg Test, Ur: NEGATIVE

## 2020-01-14 LAB — LIPASE, BLOOD: Lipase: 25 U/L (ref 11–51)

## 2020-01-14 MED ORDER — METOCLOPRAMIDE HCL 10 MG PO TABS
10.0000 mg | ORAL_TABLET | Freq: Once | ORAL | Status: AC
  Administered 2020-01-14: 10 mg via ORAL
  Filled 2020-01-14: qty 1

## 2020-01-14 MED ORDER — SODIUM CHLORIDE 0.9% FLUSH: 3.0000 mL | Freq: Once | INTRAVENOUS | Status: DC

## 2020-01-14 NOTE — ED Triage Notes (Signed)
Pt presents to ED via POV with c/o mid-LUQ abdominal pain that started approx 1 hr ago. Pt also c/o N/V with abdominal pain. Pt states pain feels like "gallbladder pain" however states had gallbladder removed in 2014.

## 2020-01-14 NOTE — ED Provider Notes (Signed)
[facility] Emergency Department Provider Note ____________________________________________   First MD Initiated Contact with Patient 01/14/20 [phone]     (approximate)  I have reviewed the triage vital signs and the nursing notes.   HISTORY  Chief Complaint Abdominal Pain    HPI Natasha Myers is a 34 y.o. female with PMH as noted below and status post cholecystectomy in 2014 who presents with epigastric abdominal pain, acute onset this morning around an hour ago, and associated with nausea and vomiting.  The patient states that it feels very similar to her gallbladder pain prior to her cholecystectomy.  It is now mostly resolved and the patient states that she mainly just feels tired.  She denies any recent change in her diet or any alcohol recently.  She did not eat anything different than usual last night.  Past Medical History:  Diagnosis Date  . GERD (gastroesophageal reflux disease)   . History of abnormal cervical Pap smear     Patient Active Problem List   Diagnosis Date Noted  . Anemia 09/14/2017    Past Surgical History:  Procedure Laterality Date  . CESAREAN SECTION    . CHOLECYSTECTOMY    . COLPOSCOPY      Prior to Admission medications   Medication Sig Start Date End Date Taking? Authorizing Provider  Cholecalciferol (VITAMIN D3) 5000 units CAPS Take 1 capsule (5,000 Units total) by mouth daily. 07/25/17   Shambley, Melody N, CNM  fluticasone (FLONASE) 50 MCG/ACT nasal spray Place into the nose. 02/04/16   [provider]  Iron-FA-B Cmp-C-Biot-Probiotic (FUSION PLUS) CAPS Take 1 capsule by mouth daily. 09/14/17   Shambley, Melody N, CNM  Prenatal Vit-Fe Fumarate-FA (MULTIVITAMIN-PRENATAL) 27-0.8 MG TABS tablet Take 1 tablet by mouth daily at 12 noon.    [provider]    Allergies Patient has no known allergies.  Family History  Problem Relation Age of Onset  . Diabetes Other        paternal side  . Hypertension  Mother   . Heart failure Father   . Rheum arthritis Father   . Hypertension Maternal Grandfather   . Cancer Paternal Grandmother        pancreatic  . Diabetes Paternal Grandmother   . Diabetes Paternal Grandfather   . Heart failure Paternal Grandfather   . Stroke Maternal Aunt   . Thyroid disease Maternal Aunt     Social History Social History   Tobacco Use  . Smoking status: Never Smoker  . Smokeless tobacco: Never Used  Substance Use Topics  . Alcohol use: Yes    Comment: Rare]  . Drug use: No    Review of Systems  Constitutional: No fever/chills. Eyes: No redness. ENT: No sore throat. Cardiovascular: Denies chest pain. Respiratory: Denies shortness of breath. Gastrointestinal: Positive for nausea. Genitourinary: Negative for dysuria.  Musculoskeletal: Negative for back pain. Skin: Negative for rash. Neurological: Negative for headache.   ____________________________________________   PHYSICAL EXAM:  VITAL SIGNS: ED Triage Vitals  Enc Vitals Group     BP 01/14/20 0848 116/78     Pulse Rate 01/14/20 0848 85     Resp 01/14/20 0848 18     Temp 01/14/20 0848 98 F (36.7 C)     Temp Source 01/14/20 0848 Oral     SpO2 01/14/20 0848 98 %     Weight 01/14/20 0843 178 lb (80.7 kg)     Height 01/14/20 0843 5\' 2"  (1.575 m)     Myers Circumference --  Peak Flow --      Pain Score 01/14/20 0843 6     Pain Loc --      Pain Edu? --      Excl. in GC? --     Constitutional: Alert and oriented. Well appearing and in no acute distress. Eyes: Conjunctivae are normal.  No scleral icterus. Myers: Atraumatic. Nose: No congestion/rhinnorhea. Mouth/Throat: Mucous membranes are moist.   Neck: Normal range of motion.  Cardiovascular: Normal rate, regular rhythm.   Good peripheral circulation. Respiratory: Normal respiratory effort.  No retractions.  Gastrointestinal: Soft and nontender. No distention.  Genitourinary: No flank tenderness. Musculoskeletal:   Extremities warm and well perfused.  Neurologic:  Normal speech and language. No gross focal neurologic deficits are appreciated.  Skin:  Skin is warm and dry. No rash noted. Psychiatric: Mood and affect are normal. Speech and behavior are normal.  ____________________________________________   LABS (all labs ordered are listed, but only abnormal results are displayed)  Labs Reviewed  CBC - Abnormal; Notable for the following components:      Result Value   RBC 3.68 (*)    Hemoglobin 11.7 (*)    HCT 34.4 (*)    All other components within normal limits  URINALYSIS, COMPLETE (UACMP) WITH MICROSCOPIC - Abnormal; Notable for the following components:   Color, Urine YELLOW (*)    APPearance CLEAR (*)    Protein, ur 30 (*)    All other components within normal limits  LIPASE, BLOOD  COMPREHENSIVE METABOLIC PANEL  POC URINE PREG, ED  POCT PREGNANCY, URINE   ____________________________________________  EKG  ED ECG REPORT I, Dionne Bucy, the attending physician, personally viewed and interpreted this ECG.  Date: 01/14/2020 EKG Time: 0845 Rate: 82 Rhythm: normal sinus rhythm QRS Axis: normal Intervals: normal ST/T Wave abnormalities: normal Narrative Interpretation: no evidence of acute ischemia  ____________________________________________  RADIOLOGY    ____________________________________________   PROCEDURES  Procedure(s) performed: No  Procedures  Critical Care performed: No ____________________________________________   INITIAL IMPRESSION / ASSESSMENT AND PLAN / ED COURSE  Pertinent labs & imaging results that were available during my care of the patient were reviewed by me and considered in my medical decision making (see chart for details).  34 year old female with PMH as noted above presents with now resolving epigastric abdominal pain that started about an hour ago and was associated with an episode of vomiting.  The patient states it feels  similar to when she had gallbladder pain, but had her gallbladder taken out 7 years ago.  On exam, the patient is well-appearing.  Her vital signs are normal.  The abdomen is soft with no focal tenderness.  Lab work-up was obtained from triage and shows no acute abnormality.  Overall presentation is consistent with gastritis, early gastroenteritis, or other benign etiology.  I will give p.o. Reglan and observe the patient short while longer.  There is no indication for imaging at this time based on her exam and labs.  If she has no recurrence of the pain, I anticipate discharge home.  ----------------------------------------- 10:40 AM on 01/14/2020 -----------------------------------------  The patient continues to be feeling well with no recurrent pain.  At this time, she is stable for discharge home.  Return precautions given, and she expresses understanding.  ____________________________________________   FINAL CLINICAL IMPRESSION(S) / ED DIAGNOSES  Final diagnoses:  Epigastric pain      NEW MEDICATIONS STARTED DURING THIS VISIT:  New Prescriptions   No medications on file  Note:  This document was prepared using Dragon voice recognition software and may include unintentional dictation errors.    Arta Silence, MD 01/14/20 1041

## 2020-01-14 NOTE — Discharge Instructions (Addendum)
Return to the ER for new, worsening, or persistent severe abdominal pain, vomiting, fever, weakness, or any other new or worsening symptoms that concern you. 

## 2021-05-05 ENCOUNTER — Encounter: Payer: Self-pay | Admitting: Emergency Medicine

## 2021-05-05 ENCOUNTER — Other Ambulatory Visit: Payer: Self-pay

## 2021-05-05 ENCOUNTER — Ambulatory Visit
Admission: EM | Admit: 2021-05-05 | Discharge: 2021-05-05 | Disposition: A | Discharge status: Home / Self Care | Payer: Managed Care, Other (non HMO)

## 2021-05-05 DIAGNOSIS — M26622 Arthralgia of left temporomandibular joint: Secondary | ICD-10-CM

## 2021-05-05 MED ORDER — METHYLPREDNISOLONE 4 MG PO TBPK: ORAL_TABLET | ORAL | 0 refills | Status: AC

## 2021-05-05 NOTE — ED Triage Notes (Signed)
Patient c/o left ear pain x 2-3 days.

## 2021-05-05 NOTE — ED Provider Notes (Signed)
[facility]    CSN: [id number] Arrival date & time: 05/05/21  0906      History   Chief Complaint Chief Complaint  Patient presents with  . Ear Pain    HPI Natasha Myers is a 35 y.o. female.   HPI   35 year old female here for evaluation of left ear pain.  Patient works that she is had pain in her left ear for last 2 to 3 days.  She has had some occasional ringing, sensitive to loud noises, headache, and intermittent dizziness.  She has taken Tylenol at home with some relief.  Patient denies fever, drainage from her ear, or upper respiratory symptoms.  Past Medical History:  Diagnosis Date  . GERD (gastroesophageal reflux disease)   . History of abnormal cervical Pap smear     Patient Active Problem List   Diagnosis Date Noted  . Anemia 09/14/2017    Past Surgical History:  Procedure Laterality Date  . CESAREAN SECTION    . CHOLECYSTECTOMY    . COLPOSCOPY      OB History    Gravida  2   Para  2   Term  2   Preterm      AB      Living  2     SAB      IAB      Ectopic      Multiple  0   Live Births  2        Obstetric Comments  Arrest of descent at 2nd stage (failed vacuum).          Home Medications    Prior to Admission medications   Medication Sig Start Date End Date Taking? Authorizing Provider  fluticasone (FLONASE) 50 MCG/ACT nasal spray Place into the nose. 02/04/16  Yes [provider]  methylPREDNISolone (MEDROL DOSEPAK) 4 MG TBPK tablet Take according to the package insert. 05/05/21  Yes Becky Augusta, NP  nortriptyline (PAMELOR) 10 MG capsule Take 10 mg in the morning and 30 mg at night 10/25/20  Yes [provider]  phentermine (ADIPEX-P) 37.5 MG tablet Take 37.5 mg by mouth daily as needed. 03/26/21  Yes [provider]    Family History Family History  Problem Relation Age of Onset  . Diabetes Other        paternal side  . Hypertension Mother   . Heart failure Father   . Rheum  arthritis Father   . Hypertension Maternal Grandfather   . Cancer Paternal Grandmother        pancreatic  . Diabetes Paternal Grandmother   . Diabetes Paternal Grandfather   . Heart failure Paternal Grandfather   . Stroke Maternal Aunt   . Thyroid disease Maternal Aunt     Social History Social History   Tobacco Use  . Smoking status: Never Smoker  . Smokeless tobacco: Never Used  Vaping Use  . Vaping Use: Never used  Substance Use Topics  . Alcohol use: Yes    Comment: Rare]  . Drug use: No     Allergies   Patient has no known allergies.   Review of Systems Review of Systems  HENT: Positive for ear pain. Negative for congestion, ear discharge and rhinorrhea.   Neurological: Positive for light-headedness and headaches.  Hematological: Negative.   Psychiatric/Behavioral: Negative.      Physical Exam Triage Vital Signs ED Triage Vitals  Enc Vitals Group     BP 05/05/21 0920 117/78  Pulse Rate 05/05/21 0920 76     Resp 05/05/21 0920 18     Temp 05/05/21 0920 98 F (36.7 C)     Temp Source 05/05/21 0920 Oral     SpO2 05/05/21 0920 100 %     Weight 05/05/21 0917 177 lb 14.6 oz (80.7 kg)     Height 05/05/21 0917 5\' 2"  (1.575 m)     Head Circumference --      Peak Flow --      Pain Score 05/05/21 0917 5     Pain Loc --      Pain Edu? --      Excl. in GC? --    No data found.  Updated Vital Signs BP 117/78 (BP Location: Left Arm)   Pulse 76   Temp 98 F (36.7 C) (Oral)   Resp 18   Ht 5\' 2"  (1.575 m)   Wt 177 lb 14.6 oz (80.7 kg)   LMP 04/28/2021   SpO2 100%   BMI 32.54 kg/m   Visual Acuity Right Eye Distance:   Left Eye Distance:   Bilateral Distance:    Right Eye Near:   Left Eye Near:    Bilateral Near:     Physical Exam Vitals and nursing note reviewed.  Constitutional:      General: She is not in acute distress.    Appearance: Normal appearance. She is not ill-appearing.  HENT:     Head: Normocephalic and atraumatic.     Right  Ear: Tympanic membrane, ear canal and external ear normal. There is no impacted cerumen.     Left Ear: Tympanic membrane, ear canal and external ear normal. There is no impacted cerumen.  Cardiovascular:     Rate and Rhythm: Normal rate and regular rhythm.     Pulses: Normal pulses.     Heart sounds: Normal heart sounds. No murmur heard. No gallop.   Pulmonary:     Effort: Pulmonary effort is normal.     Breath sounds: Normal breath sounds. No wheezing, rhonchi or rales.  Skin:    General: Skin is warm and dry.     Capillary Refill: Capillary refill takes less than 2 seconds.     Findings: No bruising or erythema.  Neurological:     General: No focal deficit present.     Mental Status: She is alert and oriented to person, place, and time.  Psychiatric:        Mood and Affect: Mood normal.        Behavior: Behavior normal.        Thought Content: Thought content normal.        Judgment: Judgment normal.      UC Treatments / Results  Labs (all labs ordered are listed, but only abnormal results are displayed) Labs Reviewed - No data to display  EKG   Radiology No results found.  Procedures Procedures (including critical care time)  Medications Ordered in UC Medications - No data to display  Initial Impression / Assessment and Plan / UC Course  I have reviewed the triage vital signs and the nursing notes.  Pertinent labs & imaging results that were available during my care of the patient were reviewed by me and considered in my medical decision making (see chart for details).   35 year old female here for evaluation of left ear pain that is been present for last 2 to 3 days.  She has had associated symptoms of intermittent ringing in her ear, sensitive  to loud noises, headache, and dizziness.  She denies any fever or drainage from her ear.  No upper respiratory symptoms.  Physical exam reveals pearly gray tympanic membranes bilaterally with a normal light reflex and clear  external auditory canals.  Patient has no significant tenderness when palpating the eustachian tubes externally on either side.  Cardiopulmonary exam is benign.  Patient does complain of pain with palpation of the left TMJ.  There is no crepitus, or subluxation with range of motion of the mandible.  Suspect patient symptoms are coming from TMJ inflammation.  She denies chewing gum, chewing hard crunchy foods, or knowing if she grinds her teeth in her sleep.  We will treat patient with a Medrol Dosepak and have her follow-up with her dentist for any continued symptoms.   Final Clinical Impressions(s) / UC Diagnoses   Final diagnoses:  Arthralgia of left temporomandibular joint     Discharge Instructions     Take the Medrol Dosepak according to the package insert.  You will taken on tapering dose over the next 6 days.  Eat soft foods and avoid eating hard crunchy foods for the next several days to decrease joint inflammation.  If your symptoms continue I suggest following up with your dentist for evaluation of possible grinding of your teeth.  If you do grind a mouthguard can help with your symptoms.    ED Prescriptions    Medication Sig Dispense Auth. Provider   methylPREDNISolone (MEDROL DOSEPAK) 4 MG TBPK tablet Take according to the package insert. 1 each Becky Augusta, NP     PDMP not reviewed this encounter.   Becky Augusta, NP 05/05/21 [phone]

## 2021-05-05 NOTE — Discharge Instructions (Addendum)
Take the Medrol Dosepak according to the package insert.  You will taken on tapering dose over the next 6 days.  Eat soft foods and avoid eating hard crunchy foods for the next several days to decrease joint inflammation.  If your symptoms continue I suggest following up with your dentist for evaluation of possible grinding of your teeth.  If you do grind a mouthguard can help with your symptoms.

## 2022-06-29 ENCOUNTER — Other Ambulatory Visit: Payer: Self-pay | Admitting: Physician Assistant

## 2022-06-29 DIAGNOSIS — R202 Paresthesia of skin: Secondary | ICD-10-CM

## 2022-06-29 DIAGNOSIS — R519 Headache, unspecified: Secondary | ICD-10-CM

## 2022-07-14 ENCOUNTER — Ambulatory Visit
Admission: RE | Admit: 2022-07-14 | Discharge: 2022-07-14 | Disposition: A | Discharge status: Home / Self Care | Payer: Managed Care, Other (non HMO) | Source: Ambulatory Visit | Attending: Physician Assistant | Admitting: Physician Assistant

## 2022-07-14 DIAGNOSIS — R200 Anesthesia of skin: Secondary | ICD-10-CM

## 2022-07-14 DIAGNOSIS — R519 Headache, unspecified: Secondary | ICD-10-CM

## 2022-07-14 MED ORDER — GADOBENATE DIMEGLUMINE 529 MG/ML IV SOLN
15.0000 mL | Freq: Once | INTRAVENOUS | Status: AC | PRN
  Administered 2022-07-14: 15 mL via INTRAVENOUS

## 2022-09-30 ENCOUNTER — Other Ambulatory Visit: Payer: Self-pay | Admitting: Obstetrics and Gynecology

## 2022-09-30 DIAGNOSIS — Z1231 Encounter for screening mammogram for malignant neoplasm of breast: Secondary | ICD-10-CM

## 2024-03-23 ENCOUNTER — Inpatient Hospital Stay: Payer: Medicaid Other | Attending: Oncology | Admitting: Licensed Clinical Social Worker

## 2024-03-23 ENCOUNTER — Encounter: Payer: Self-pay | Admitting: Licensed Clinical Social Worker

## 2024-03-23 ENCOUNTER — Other Ambulatory Visit: Payer: Self-pay | Admitting: Licensed Clinical Social Worker

## 2024-03-23 ENCOUNTER — Inpatient Hospital Stay: Payer: Medicaid Other

## 2024-03-23 DIAGNOSIS — Z803 Family history of malignant neoplasm of breast: Secondary | ICD-10-CM | POA: Diagnosis not present

## 2024-03-23 DIAGNOSIS — Z800 Family history of malignant neoplasm of digestive organs: Secondary | ICD-10-CM | POA: Diagnosis not present

## 2024-03-23 DIAGNOSIS — Z8042 Family history of malignant neoplasm of prostate: Secondary | ICD-10-CM | POA: Diagnosis not present

## 2024-03-23 LAB — GENETIC SCREENING ORDER

## 2024-03-23 NOTE — Progress Notes (Signed)
 REFERRING PROVIDER: Schermerhorn, Festus Holts, MD [address] [place],  [place] [address]  PRIMARY PROVIDER:  Mick Sell, MD  PRIMARY REASON FOR VISIT:  1. Family history of breast cancer   2. Family history of colon cancer   3. Family history of pancreatic cancer   4. Family history of prostate cancer      HISTORY OF PRESENT ILLNESS:   Natasha Myers, a 38 y.o. female, was seen for a [place] cancer genetics consultation at the request of Dr. Feliberto Gottron due to a family history of cancer.  [name] presents to clinic today to discuss the possibility of a hereditary predisposition to cancer, genetic testing, and to further clarify her future cancer risks, as well as potential cancer risks for family members.   CANCER HISTORY:  [name] is a 38 y.o. female with no personal history of cancer.    RISK FACTORS:  Menarche was at age 62.  First live birth at age 70.  Ovaries intact: yes.  Hysterectomy: no.  Menopausal status: premenopausal.  Colonoscopy: n/a. Mammogram within the last year: n/a. Up to date with pelvic exams: yes.  Past Medical History:  Diagnosis Date   GERD (gastroesophageal reflux disease)    History of abnormal cervical Pap smear     Past Surgical History:  Procedure Laterality Date   CESAREAN SECTION     CHOLECYSTECTOMY     COLPOSCOPY      FAMILY HISTORY:  We obtained a detailed, 4-generation family history.  Significant diagnoses are listed below: Family History  Problem Relation Age of Onset   Hypertension Mother    Heart failure Father    Rheum arthritis Father    Stroke Maternal Aunt    Thyroid disease Maternal Aunt    Breast cancer Maternal Aunt        dx twice in 66s, dbl mast   Colon cancer Maternal Aunt        dx 36s   Colon cancer Paternal Aunt 102   Hypertension Maternal Grandfather    Pancreatic cancer Paternal Grandmother 101   Diabetes Paternal Grandmother    Diabetes Paternal Grandfather    Heart failure Paternal  Grandfather    Prostate cancer Paternal Grandfather    Diabetes Other        paternal side   Breast cancer Other    Breast cancer Cousin 36   [name] has 2 sons. She has 1 maternal half brother, no cancers.  [name] mother is living at 77. Patient's maternal aunt had breast cancer twice in her 32s and had a double mastectomy. Another aunt had colon cancer in her 53s. A maternal cousin had breast cancer at 4. A maternal great aunt (grandfather's sister) had breast cancer. Patient's maternal grandmother may have had stomach cancer.  [name] father is living at 71. Patient's paternal aunt had colon cancer at 44 and was treated at [facility], unknown if she had genetic testing. Patient's paternal grandmother had pancreatic cancer and passed at 44. Paternal grandfather had prostate cancer and passed at 11.   [name] is unaware of previous family history of genetic testing for hereditary cancer risks. There is no reported Ashkenazi Jewish ancestry. There is no known consanguinity.    GENETIC COUNSELING ASSESSMENT: Ms. Yost is a 38 y.o. female with a family history of breast and other cancers which is somewhat suggestive of a hereditary cancer syndrome and predisposition to cancer. We, therefore, discussed and recommended the following at today's visit.  DISCUSSION: We discussed that approximately 10% of breast cancer is hereditary. Most cases of hereditary breast cancer are associated with BRCA1/BRCA2 genes. These can also increase the risk for pancreatic cancer. There are other genes associated with hereditary cancer as well. Cancers and risks are gene specific. We discussed that testing is beneficial for several reasons including knowing about cancer risks, identifying potential screening and risk-reduction options that may be appropriate, and to understand if other family members could be at risk for cancer and allow them to undergo genetic testing.   We reviewed the characteristics, features  and inheritance patterns of hereditary cancer syndromes. We also discussed genetic testing, including the appropriate family members to test, the process of testing, insurance coverage and turn-around-time for results. We discussed the implications of a negative, positive and/or variant of uncertain significant result. We recommended Ms. Sullenger pursue genetic testing for the [facility] CancerNext-Expanded+RNA gene panel.   Based on [name]'s family history of cancer, she meets medical criteria for genetic testing. Though [name] is not personally affected, there are no affected family members that are willing/able to undergo hereditary cancer testing.  Therefore, [name] the most informative family member available. Despite that she meets criteria, she may still have an out of pocket cost.   We discussed that some people do not want to undergo genetic testing due to fear of genetic discrimination.  A federal law called the Genetic Information Non-Discrimination Act (GINA) of 2008 helps protect individuals against genetic discrimination based on their genetic test results.  It impacts both health insurance and employment.  For health insurance, it protects against increased premiums, being kicked off insurance or being forced to take a test in order to be insured.  For employment it protects against hiring, firing and promoting decisions based on genetic test results.  Health status due to a cancer diagnosis is not protected under GINA.  This law does not protect life insurance, disability insurance, or other types of insurance.   PLAN: After considering the risks, benefits, and limitations, [name] provided informed consent to pursue genetic testing and the blood sample was sent to [facility] for analysis of the CancerNext-Expanded+RNA panel. Results should be available within approximately 2-3 weeks' time, at which point they will be disclosed by telephone to [name], as will any additional  recommendations warranted by these results. [name] will receive a summary of her genetic counseling visit and a copy of her results once available. This information will also be available in Epic.   Ms. Melby questions were answered to her satisfaction today. Our contact information was provided should additional questions or concerns arise. Thank you for the referral and allowing us  to share in the care of your patient.   Valri Gee, MS, [facility] Genetic Counselor [email or web address] Phone: [phone]  60 minutes were spent on the date of the encounter in service to the patient including preparation, face-to-face consultation, documentation and care coordination. Dr. Nelson Bandy was available for discussion regarding this case.   _______________________________________________________________________ For Office Staff:  Number of people involved in session: 1 Was an Intern/ student involved with case: no

## 2024-04-12 ENCOUNTER — Encounter: Payer: Self-pay | Admitting: Licensed Clinical Social Worker

## 2024-04-12 ENCOUNTER — Ambulatory Visit: Payer: Self-pay | Admitting: Licensed Clinical Social Worker

## 2024-04-12 ENCOUNTER — Telehealth: Payer: Self-pay | Admitting: Licensed Clinical Social Worker

## 2024-04-12 DIAGNOSIS — Z1379 Encounter for other screening for genetic and chromosomal anomalies: Secondary | ICD-10-CM

## 2024-04-12 NOTE — Progress Notes (Signed)
 HPI:   Natasha Myers was previously seen in the [facility] due to a family history of cancer and concerns regarding a hereditary predisposition to cancer. Please refer to our prior cancer genetics clinic note for more information regarding our discussion, assessment and recommendations, at the time. Natasha Myers recent genetic test results were disclosed to her, as were recommendations warranted by these results. These results and recommendations are discussed in more detail below.  CANCER HISTORY:  Oncology History   No history exists.    FAMILY HISTORY:  We obtained a detailed, 4-generation family history.  Significant diagnoses are listed below: Family History  Problem Relation Age of Onset   Hypertension Mother    Heart failure Father    Rheum arthritis Father    Stroke Maternal Aunt    Thyroid disease Maternal Aunt    Breast cancer Maternal Aunt        dx twice in 6s, dbl mast   Colon cancer Maternal Aunt        dx 47s   Colon cancer Paternal Aunt 20   Hypertension Maternal Grandfather    Pancreatic cancer Paternal Grandmother 27   Diabetes Paternal Grandmother    Diabetes Paternal Grandfather    Heart failure Paternal Grandfather    Prostate cancer Paternal Grandfather    Diabetes Other        paternal side   Breast cancer Other    Breast cancer Cousin 40    Natasha Myers has 2 sons. She has 1 maternal half brother, no cancers.   Natasha Myers mother is living at 71. Patient's maternal aunt had breast cancer twice in her 42s and had a double mastectomy. Another aunt had colon cancer in her 56s. A maternal cousin had breast cancer at 71. A maternal great aunt (grandfather's sister) had breast cancer. Patient's maternal grandmother may have had stomach cancer.   Natasha Myers father is living at 47. Patient's paternal aunt had colon cancer at 64 and was treated at [facility], unknown if she had genetic testing. Patient's paternal grandmother had pancreatic cancer and passed  at 37. Paternal grandfather had prostate cancer and passed at 81.    Natasha Myers is unaware of previous family history of genetic testing for hereditary cancer risks. There is no reported Ashkenazi Jewish ancestry. There is no known consanguinity.     GENETIC TEST RESULTS:  The [facility] CancerNext-Expanded+RNA Panel found no pathogenic mutations.   The CancerNext-Expanded gene panel offered by [facility] and includes sequencing, rearrangement, and RNA analysis for the following 76 genes: AIP, ALK, APC, ATM, AXIN2, BAP1, BARD1, BMPR1A, BRCA1, BRCA2, BRIP1, CDC73, CDH1, CDK4, CDKN1B, CDKN2A, CEBPA, CHEK2, CTNNA1, DDX41, DICER1, ETV6, FH, FLCN, GATA2, LZTR1, MAX, MBD4, MEN1, MET, MLH1, MSH2, MSH3, MSH6, MUTYH, NF1, NF2, NTHL1, PALB2, PHOX2B, PMS2, POT1, PRKAR1A, PTCH1, PTEN, RAD51C, RAD51D, RB1, RET, RUNX1, SDHA, SDHAF2, SDHB, SDHC, SDHD, SMAD4, SMARCA4, SMARCB1, SMARCE1, STK11, SUFU, TMEM127, TP53, TSC1, TSC2, VHL, and WT1 (sequencing and deletion/duplication); EGFR, HOXB13, KIT, MITF, PDGFRA, POLD1, and POLE (sequencing only); EPCAM and GREM1 (deletion/duplication only).   The test report has been scanned into EPIC and is located under the Molecular Pathology section of the Results Review tab.  A portion of the result report is included below for reference. Genetic testing reported out on 04/11/2024.     Even though a pathogenic variant was not identified, possible explanations for the cancer in the family may include: There may be no hereditary risk for cancer in the family.  The cancers in Natasha Myers and/or her family may be sporadic/familial or due to other genetic and environmental factors. There may be a gene mutation in one of these genes that current testing methods cannot detect but that chance is small. There could be another gene that has not yet been discovered, or that we have not yet tested, that is responsible for the cancer diagnoses in the family.  It is also possible there is a hereditary  cause for the cancer in the family that Natasha Myers did not inherit. Therefore, it is important to remain in touch with cancer genetics in the future so that we can continue to offer Natasha Myers the most up to date genetic testing.   ADDITIONAL GENETIC TESTING:  We discussed with Natasha Myers that her genetic testing was fairly extensive.  If there are additional relevant genes identified to increase cancer risk that can be analyzed in the future, we would be happy to discuss and coordinate this testing at that time.    CANCER SCREENING RECOMMENDATIONS:  Natasha Myers test result is considered negative (normal).  This means that we have not identified a hereditary cause for her family history of cancer at this time.   An individual's cancer risk and medical management are not determined by genetic test results alone. Overall cancer risk assessment incorporates additional factors, including personal medical history, family history, and any available genetic information that may result in a personalized plan for cancer prevention and surveillance. Therefore, it is recommended she continue to follow the cancer management and screening guidelines provided by her  primary healthcare provider.  RECOMMENDATIONS FOR FAMILY MEMBERS:   Since she did not inherit a identifiable mutation in a cancer predisposition gene included on this panel, her children could not have inherited a known mutation from her in one of these genes. Individuals in this family might be at some increased risk of developing cancer, over the general population risk, due to the family history of cancer.  Individuals in the family should notify their providers of the family history of cancer. We recommend women in this family have a yearly mammogram beginning at age 73, or 20 years younger than the earliest onset of cancer, an annual clinical breast exam, and perform monthly breast self-exams.  Family members should have colonoscopies by at age 78, or  earlier, as recommended by their providers. Other members of the family may still carry a pathogenic variant in one of these genes that Natasha Myers did not inherit. Based on the family history, we recommend her maternal/paternal relatives who have had cancer have genetic counseling and testing. Natasha Myers will let us  know if we can be of any assistance in coordinating genetic counseling and/or testing for this family member.    FOLLOW-UP:  Lastly, we discussed with Natasha Myers that cancer genetics is a rapidly advancing field and it is possible that new genetic tests will be appropriate for her and/or her family members in the future. We encouraged her to remain in contact with cancer genetics on an annual basis so we can update her personal and family histories and let her know of advances in cancer genetics that may benefit this family.   Our contact number was provided. Natasha Myers questions were answered to her satisfaction, and she knows she is welcome to call us  at anytime with additional questions or concerns.    Valri Gee, MS, [facility] Genetic Counselor [email or web address] Phone: [phone]

## 2024-04-12 NOTE — Telephone Encounter (Signed)
 I contacted Ms. Natasha Myers to discuss her genetic testing results. No pathogenic variants were identified in the 76 genes analyzed. Detailed clinic note to follow.   The test report has been scanned into EPIC and is located under the Molecular Pathology section of the Results Review tab.  A portion of the result report is included below for reference.      Valri Gee, MS, [facility] Genetic Counselor [email or web address] Phone: [phone]

## 2024-04-14 ENCOUNTER — Other Ambulatory Visit: Payer: Self-pay | Admitting: Obstetrics

## 2024-04-14 DIAGNOSIS — N6342 Unspecified lump in left breast, subareolar: Secondary | ICD-10-CM

## 2024-04-18 ENCOUNTER — Ambulatory Visit
Admission: RE | Admit: 2024-04-18 | Discharge: 2024-04-18 | Discharge status: Home / Self Care | Source: Ambulatory Visit | Attending: Obstetrics | Admitting: Obstetrics

## 2024-04-18 ENCOUNTER — Ambulatory Visit
Admission: RE | Admit: 2024-04-18 | Discharge: 2024-04-18 | Disposition: A | Discharge status: Home / Self Care | Source: Ambulatory Visit | Attending: Obstetrics | Admitting: Obstetrics

## 2024-04-18 DIAGNOSIS — N6342 Unspecified lump in left breast, subareolar: Secondary | ICD-10-CM | POA: Insufficient documentation

## 2024-06-05 ENCOUNTER — Emergency Department

## 2024-06-05 ENCOUNTER — Encounter: Payer: Self-pay | Admitting: Emergency Medicine

## 2024-06-05 ENCOUNTER — Emergency Department
Admission: EM | Admit: 2024-06-05 | Discharge: 2024-06-05 | Disposition: A | Discharge status: Home / Self Care | Attending: Emergency Medicine | Admitting: Emergency Medicine

## 2024-06-05 ENCOUNTER — Other Ambulatory Visit: Payer: Self-pay

## 2024-06-05 DIAGNOSIS — S93402A Sprain of unspecified ligament of left ankle, initial encounter: Secondary | ICD-10-CM | POA: Insufficient documentation

## 2024-06-05 DIAGNOSIS — Y92002 Bathroom of unspecified non-institutional (private) residence single-family (private) house as the place of occurrence of the external cause: Secondary | ICD-10-CM | POA: Insufficient documentation

## 2024-06-05 DIAGNOSIS — W19XXXA Unspecified fall, initial encounter: Secondary | ICD-10-CM | POA: Insufficient documentation

## 2024-06-05 DIAGNOSIS — S99912A Unspecified injury of left ankle, initial encounter: Secondary | ICD-10-CM | POA: Diagnosis present

## 2024-06-05 DIAGNOSIS — M25572 Pain in left ankle and joints of left foot: Secondary | ICD-10-CM

## 2024-06-05 MED ORDER — HYDROCODONE-ACETAMINOPHEN 5-325 MG PO TABS: 1.0000 | ORAL_TABLET | Freq: Four times a day (QID) | ORAL | 0 refills | Status: AC | PRN

## 2024-06-05 MED ORDER — IBUPROFEN 800 MG PO TABS: 800.0000 mg | ORAL_TABLET | Freq: Three times a day (TID) | ORAL | 0 refills | Status: AC | PRN

## 2024-06-05 MED ORDER — HYDROCODONE-ACETAMINOPHEN 5-325 MG PO TABS
1.0000 | ORAL_TABLET | Freq: Once | ORAL | Status: AC
  Administered 2024-06-05: 1 via ORAL
  Filled 2024-06-05: qty 1

## 2024-06-05 MED ORDER — IBUPROFEN 800 MG PO TABS
800.0000 mg | ORAL_TABLET | Freq: Once | ORAL | Status: AC
  Administered 2024-06-05: 800 mg via ORAL
  Filled 2024-06-05: qty 1

## 2024-06-05 NOTE — ED Provider Notes (Signed)
 [facility] Provider Note    Event Date/Time   First MD Initiated Contact with Patient 06/05/24 0148     (approximate)   History   Ankle Pain   HPI  Natasha Myers is a 38 y.o. female who presents to the ED from home with a chief complaint of left ankle pain/injury.  Patient fell in the bathroom while getting ready to shower.  States she heard a crack and is unable to bear weight on her left ankle.  Denies striking head or LOC.  Voices no other complaints or injuries.     Past Medical History   Past Medical History:  Diagnosis Date   GERD (gastroesophageal reflux disease)    History of abnormal cervical Pap smear      Active Problem List   Patient Active Problem List   Diagnosis Date Noted   Genetic testing 04/12/2024   Anemia 09/14/2017     Past Surgical History   Past Surgical History:  Procedure Laterality Date   CESAREAN SECTION     CHOLECYSTECTOMY     COLPOSCOPY       Home Medications   Prior to Admission medications   Medication Sig Start Date End Date Taking? Authorizing Provider  HYDROcodone-acetaminophen  (NORCO/VICODIN) 5-325 MG tablet Take 1 tablet by mouth every 6 (six) hours as needed for moderate pain (pain score 4-6). 06/05/24  Yes Robinette Vermell PARAS, MD  ibuprofen  (ADVIL ) 800 MG tablet Take 1 tablet (800 mg total) by mouth every 8 (eight) hours as needed for moderate pain (pain score 4-6). 06/05/24  Yes Germain Koopmann J, MD  fluticasone (FLONASE) 50 MCG/ACT nasal spray Place into the nose. 02/04/16   [provider]  methylPREDNISolone  (MEDROL  DOSEPAK) 4 MG TBPK tablet Take according to the package insert. 05/05/21   Bernardino Ditch, NP  nortriptyline (PAMELOR) 10 MG capsule Take 10 mg in the morning and 30 mg at night 10/25/20   [provider]  phentermine (ADIPEX-P) 37.5 MG tablet Take 37.5 mg by mouth daily as needed. 03/26/21   [provider]     Allergies  Patient has no known allergies.   Family  History   Family History  Problem Relation Age of Onset   Hypertension Mother    Heart failure Father    Rheum arthritis Father    Stroke Maternal Aunt    Thyroid disease Maternal Aunt    Breast cancer Maternal Aunt        dx twice in 33s, dbl mast   Colon cancer Maternal Aunt        dx 13s   Colon cancer Paternal Aunt 95   Hypertension Maternal Grandfather    Pancreatic cancer Paternal Grandmother 23   Diabetes Paternal Grandmother    Diabetes Paternal Grandfather    Heart failure Paternal Grandfather    Prostate cancer Paternal Grandfather    Diabetes Other        paternal side   Breast cancer Other    Breast cancer Cousin 39     Physical Exam  Triage Vital Signs: ED Triage Vitals  Encounter Vitals Group     BP 06/05/24 0046 (!) 123/91     Girls Systolic BP Percentile --      Girls Diastolic BP Percentile --      Boys Systolic BP Percentile --      Boys Diastolic BP Percentile --      Pulse Rate 06/05/24 0046 88     Resp 06/05/24 0046  20     Temp 06/05/24 0046 98.2 F (36.8 C)     Temp Source 06/05/24 0046 Oral     SpO2 06/05/24 0046 99 %     Weight 06/05/24 0046 183 lb (83 kg)     Height 06/05/24 0046 5' 1 (1.549 m)     Head Circumference --      Peak Flow --      Pain Score 06/05/24 0051 8     Pain Loc --      Pain Education --      Exclude from Growth Chart --     Updated Vital Signs: BP (!) 123/91 (BP Location: Left Arm)   Pulse 88   Temp 98.2 F (36.8 C) (Oral)   Resp 20   Ht 5' 1 (1.549 m)   Wt 83 kg   SpO2 99%   BMI 34.58 kg/m    General: Awake, no distress.  CV:  Good peripheral perfusion.  Resp:  Normal effort.  Abd:  No distention.  Other:  Mild swelling to left lateral malleolus.  Decreased range of motion secondary to pain.  2+ distal pulses.  Brisk, less than 5-second cap refill.   ED Results / Procedures / Treatments  Labs (all labs ordered are listed, but only abnormal results are displayed) Labs Reviewed - No data to  display   EKG  None   RADIOLOGY I have independently visualized and interpreted patient's imaging study as well as noted the radiology interpretation:  Left ankle: Negative  Official radiology report(s): DG Ankle Complete Left Result Date: 06/05/2024 CLINICAL DATA:  Fall, acute pain, heard a crack. Unable to bear weight. EXAM: LEFT ANKLE COMPLETE - 3+ VIEW COMPARISON:  None Available. FINDINGS: There is no evidence of fracture, dislocation, or joint effusion. There is no evidence of arthropathy or other focal bone abnormality. Soft tissues are unremarkable. IMPRESSION: Negative. Electronically Signed   By: Norman Gatlin M.D.   On: 06/05/2024 01:22     PROCEDURES:  Critical Care performed: No  Procedures   MEDICATIONS ORDERED IN ED: Medications  HYDROcodone-acetaminophen  (NORCO/VICODIN) 5-325 MG per tablet 1 tablet (has no administration in time range)  ibuprofen  (ADVIL ) tablet 800 mg (has no administration in time range)     IMPRESSION / MDM / ASSESSMENT AND PLAN / ED COURSE  I reviewed the triage vital signs and the nursing notes.                             38 year old female presenting with left ankle pain/injury.  X-rays negative for acute osseous injury.  Administer NSAIDs, analgesia here, placed in ankle stirrup splint and crutches.  Patient works for [employer] and would like to follow-up with podiatry there.  Strict return precautions given.  Patient verbalized understanding and agrees with plan of care.  Patient's presentation is most consistent with acute, uncomplicated illness.    FINAL CLINICAL IMPRESSION(S) / ED DIAGNOSES   Final diagnoses:  Sprain of left ankle, unspecified ligament, initial encounter  Acute left ankle pain     Rx / DC Orders   ED Discharge Orders          Ordered    ibuprofen  (ADVIL ) 800 MG tablet  Every 8 hours PRN        06/05/24 0213    HYDROcodone-acetaminophen  (NORCO/VICODIN) 5-325 MG tablet  Every 6 hours PRN         06/05/24 0213  Note:  This document was prepared using Dragon voice recognition software and may include unintentional dictation errors.   Hjalmar Ballengee J, MD 06/05/24 [phone]

## 2024-06-05 NOTE — ED Triage Notes (Signed)
 Pt to ED from home c/o mechanical fall tonight with left ankle pain.  States heard a crack, unable to bear weight.  No obvious swelling or deformity.  (+) pedal pulse.

## 2024-06-05 NOTE — Discharge Instructions (Signed)
1. Take pain medicines as needed (Motrin/Norco#15). 2. Wear ankle splint & use crutches as needed. 3. Elevate affected area & apply ice over splint several times daily. 4. Return to the ER for worsening symptoms, increased swelling, numbness/tingling or other concerns.

## 2024-11-23 ENCOUNTER — Other Ambulatory Visit: Payer: Self-pay

## 2024-11-23 MED ORDER — PROMETHAZINE-DM 6.25-15 MG/5ML PO SYRP
5.0000 mL | ORAL_SOLUTION | Freq: Four times a day (QID) | ORAL | 0 refills | Status: AC | PRN
  Filled 2024-11-23: qty 120, 6d supply, fill #0

## 2024-11-23 MED FILL — Cefdinir Cap 300 MG: 300.0000 mg | ORAL | 10 days supply | Qty: 20 | Fill #0 | Status: AC

## 2024-12-28 ENCOUNTER — Other Ambulatory Visit: Payer: Self-pay

## 2024-12-28 MED ORDER — OMEPRAZOLE 40 MG PO CPDR
40.0000 mg | DELAYED_RELEASE_CAPSULE | Freq: Every day | ORAL | 11 refills | Status: AC
  Filled 2024-12-28: qty 30, 30d supply, fill #0

## 2024-12-28 MED ORDER — CITALOPRAM HYDROBROMIDE 10 MG PO TABS
10.0000 mg | ORAL_TABLET | Freq: Every day | ORAL | 11 refills | Status: AC
  Filled 2024-12-28: qty 30, 30d supply, fill #0

## 2025-03-06 ENCOUNTER — Institutional Professional Consult (permissible substitution): Admitting: Plastic Surgery
